# Patient Record
Sex: Female | Born: 1965
Health system: Southern US, Community
[De-identification: ages and names within clinical notes are randomized; demographics above are authoritative.]

## PROBLEM LIST (undated history)

## (undated) DIAGNOSIS — J45909 Unspecified asthma, uncomplicated: Secondary | ICD-10-CM

## (undated) DIAGNOSIS — T7840XA Allergy, unspecified, initial encounter: Secondary | ICD-10-CM

## (undated) DIAGNOSIS — M1990 Unspecified osteoarthritis, unspecified site: Secondary | ICD-10-CM

## (undated) DIAGNOSIS — Z8041 Family history of malignant neoplasm of ovary: Secondary | ICD-10-CM

## (undated) HISTORY — DX: Unspecified osteoarthritis, unspecified site: M19.90

## (undated) HISTORY — DX: Allergy, unspecified, initial encounter: T78.40XA

## (undated) HISTORY — DX: Unspecified asthma, uncomplicated: J45.909

## (undated) HISTORY — DX: Family history of malignant neoplasm of ovary: Z80.41

## (undated) HISTORY — PX: TUBAL LIGATION: SHX77

---

## 2014-02-16 DIAGNOSIS — J302 Other seasonal allergic rhinitis: Secondary | ICD-10-CM | POA: Insufficient documentation

## 2014-03-14 ENCOUNTER — Ambulatory Visit: Payer: Self-pay | Admitting: Family Medicine

## 2014-03-31 ENCOUNTER — Ambulatory Visit: Payer: Self-pay | Admitting: Family Medicine

## 2016-07-05 ENCOUNTER — Emergency Department: Payer: BLUE CROSS/BLUE SHIELD

## 2016-07-05 ENCOUNTER — Emergency Department
Admission: EM | Admit: 2016-07-05 | Discharge: 2016-07-05 | Disposition: A | Discharge status: Home / Self Care | Payer: BLUE CROSS/BLUE SHIELD | Attending: Emergency Medicine | Admitting: Emergency Medicine

## 2016-07-05 ENCOUNTER — Encounter: Payer: Self-pay | Admitting: Emergency Medicine

## 2016-07-05 DIAGNOSIS — M25562 Pain in left knee: Secondary | ICD-10-CM | POA: Diagnosis present

## 2016-07-05 MED ORDER — CYCLOBENZAPRINE HCL 10 MG PO TABS
10.0000 mg | ORAL_TABLET | Freq: Once | ORAL | Status: AC
  Administered 2016-07-05: 10 mg via ORAL
  Filled 2016-07-05: qty 1

## 2016-07-05 MED ORDER — NAPROXEN 500 MG PO TABS: 500.0000 mg | ORAL_TABLET | Freq: Two times a day (BID) | ORAL | 0 refills | Status: DC

## 2016-07-05 MED ORDER — ACETAMINOPHEN 325 MG PO TABS
650.0000 mg | ORAL_TABLET | Freq: Once | ORAL | Status: AC
  Administered 2016-07-05: 650 mg via ORAL
  Filled 2016-07-05: qty 2

## 2016-07-05 MED ORDER — CYCLOBENZAPRINE HCL 10 MG PO TABS: 10.0000 mg | ORAL_TABLET | Freq: Three times a day (TID) | ORAL | 0 refills | Status: DC | PRN

## 2016-07-05 NOTE — ED Notes (Signed)
Pt. States pain to lt. Knee for the past couple weeks.  Pt. States tonight she was entering a pickup up truck to buckle child in car seat when she heard a "popping noise" from lt. Knee.  Pt. States increased pain to lt. Knee.

## 2016-07-05 NOTE — ED Triage Notes (Addendum)
Pt to ed c/o left knee pain after jumping up into truck and feeling a pop in calf area of left leg and knee.

## 2016-07-05 NOTE — Discharge Instructions (Signed)
Follow up with orthopedics in about a week if not improving.  Return to the ER for symptoms that change or worsen if unable to schedule an appointment.

## 2016-07-05 NOTE — ED Notes (Signed)
Pt. Going home with husband. 

## 2016-07-05 NOTE — ED Provider Notes (Signed)
[facility] Emergency Department Provider Note ____________________________________________  Time seen: Approximately 8:27 PM  I have reviewed the triage vital signs and the nursing notes.   HISTORY  Chief Complaint Knee Pain  HPI Leslie Mcdonald is a 51 y.o. female who presents to the emergency department for evaluation of pain in the left knee. She states that she was trying to buckle her grandchild in the car seat in the back of her truck when she heard a pop and a crunch and immediately felt pain in the left posterior leg/knee that then radiated around and into the anterior aspect. She denies falling. She states pain is now worse if she attempts to turn the knee outward or with any weight bearing. She has not taken anything for pain since the injury. She states that she has had some intermittent pain in the left knee for the past couple of weeks, but denies specific injury prior to tonight.  History reviewed. No pertinent past medical history.  There are no active problems to display for this patient.   History reviewed. No pertinent surgical history.  Prior to Admission medications   Medication Sig Start Date End Date Taking? Authorizing Provider  cyclobenzaprine (FLEXERIL) 10 MG tablet Take 1 tablet (10 mg total) by mouth 3 (three) times daily as needed for muscle spasms. 07/05/16   Chinita Pesterari B Domonique Brouillard, FNP  naproxen (NAPROSYN) 500 MG tablet Take 1 tablet (500 mg total) by mouth 2 (two) times daily with a meal. 07/05/16   Chinita Pesterari B Hesston Hitchens, FNP    Allergies Patient has no known allergies.  History reviewed. No pertinent family history.  Social History Social History  Substance Use Topics  . Smoking status: Never Smoker  . Smokeless tobacco: Never Used  . Alcohol use No    Review of Systems Constitutional: No recent illness. Cardiovascular: Denies chest pain or palpitations. Respiratory: Denies shortness of breath. Musculoskeletal: Pain in left  knee. Skin: Negative for rash, wound, lesion. Neurological: Negative for focal weakness or numbness.  ____________________________________________   PHYSICAL EXAM:  VITAL SIGNS: ED Triage Vitals  Enc Vitals Group     BP 07/05/16 1943 119/61     Pulse Rate 07/05/16 1943 77     Resp 07/05/16 1943 18     Temp 07/05/16 1943 98.7 F (37.1 C)     Temp Source 07/05/16 1943 Oral     SpO2 07/05/16 1943 100 %     Weight 07/05/16 1943 240 lb (108.9 kg)     Height 07/05/16 1943 5\' 7"  (1.702 m)     Head Circumference --      Peak Flow --      Pain Score 07/05/16 1854 10     Pain Loc --      Pain Edu? --      Excl. in GC? --     Constitutional: Alert and oriented. Well appearing and in no acute distress. Eyes: Conjunctivae are normal. EOMI. Head: Atraumatic. Neck: No stridor.  Respiratory: Normal respiratory effort.   Musculoskeletal: Left Knee: Pain on varus stress. Patella tracks midline. Significant increase in pain with active flexion knee. Negative Ballottement test. Thompson's test is negative. Neurologic:  Normal speech and language. No gross focal neurologic deficits are appreciated. Speech is normal. Gait not tested due to pain.  Skin:  Skin is warm, dry and intact. Atraumatic. Psychiatric: Mood and affect are normal. Speech and behavior are normal.  ____________________________________________   LABS (all labs ordered are listed, but only abnormal  results are displayed)  Labs Reviewed - No data to display ____________________________________________  RADIOLOGY  Left knee negative for acute bony abnormality per radiology.  ____________________________________________   PROCEDURES  Procedure(s) performed: Knee immobilizer applied to left knee by ER tech. Patient neurovascularly intact post application.  ____________________________________________   INITIAL IMPRESSION / ASSESSMENT AND PLAN / ED COURSE  51 year old female presenting to the emergency department  for evaluation of left knee pain. Exam and imaging consistent with ligamentous strain. She was placed in a knee immobilizer and advised to call and schedule a follow-up appointment with orthopedics if her symptoms are not improving over the week. She'll be given a prescription for Flexeril and Naprosyn and advised to rest, ice, and elevate the left lower extremity often throughout the day. She was instructed to return to emergency department for symptoms change or worsen if she is unable schedule an appointment.  Pertinent labs & imaging results that were available during my care of the patient were reviewed by me and considered in my medical decision making (see chart for details).  _________________________________________   FINAL CLINICAL IMPRESSION(S) / ED DIAGNOSES  Final diagnoses:  Acute pain of left knee    Discharge Medication List as of 07/05/2016  9:42 PM    START taking these medications   Details  cyclobenzaprine (FLEXERIL) 10 MG tablet Take 1 tablet (10 mg total) by mouth 3 (three) times daily as needed for muscle spasms., Starting Sat 07/05/2016, Print    naproxen (NAPROSYN) 500 MG tablet Take 1 tablet (500 mg total) by mouth 2 (two) times daily with a meal., Starting Sat 07/05/2016, Print        If controlled substance prescribed during this visit, 12 month history viewed on the [facility] prior to issuing an initial prescription for Schedule II or III opiod.    Chinita Pester, FNP 07/06/16 0020    Emily Filbert, MD 07/06/16 [phone]

## 2016-11-27 DIAGNOSIS — M25662 Stiffness of left knee, not elsewhere classified: Secondary | ICD-10-CM | POA: Diagnosis not present

## 2016-11-27 DIAGNOSIS — M25562 Pain in left knee: Secondary | ICD-10-CM | POA: Diagnosis not present

## 2016-11-27 DIAGNOSIS — M6281 Muscle weakness (generalized): Secondary | ICD-10-CM | POA: Diagnosis not present

## 2016-12-11 DIAGNOSIS — M25662 Stiffness of left knee, not elsewhere classified: Secondary | ICD-10-CM | POA: Diagnosis not present

## 2016-12-11 DIAGNOSIS — M6281 Muscle weakness (generalized): Secondary | ICD-10-CM | POA: Diagnosis not present

## 2016-12-11 DIAGNOSIS — M25562 Pain in left knee: Secondary | ICD-10-CM | POA: Diagnosis not present

## 2016-12-18 DIAGNOSIS — M25562 Pain in left knee: Secondary | ICD-10-CM | POA: Diagnosis not present

## 2016-12-18 DIAGNOSIS — M25662 Stiffness of left knee, not elsewhere classified: Secondary | ICD-10-CM | POA: Diagnosis not present

## 2016-12-18 DIAGNOSIS — M6281 Muscle weakness (generalized): Secondary | ICD-10-CM | POA: Diagnosis not present

## 2018-09-21 IMAGING — CR DG KNEE COMPLETE 4+V*L*
1 series · 4 of 4 positions shown · non-contrast
Comparison: None.

CLINICAL DATA: Left knee pain after jumping up into a truck earlier
tonight.

EXAM:
LEFT KNEE - COMPLETE 4+ VIEW

[Series 1: dg knee complete 4 views left · 0.14mm/px · 4 of 4 slices shown]
[im 1/4]
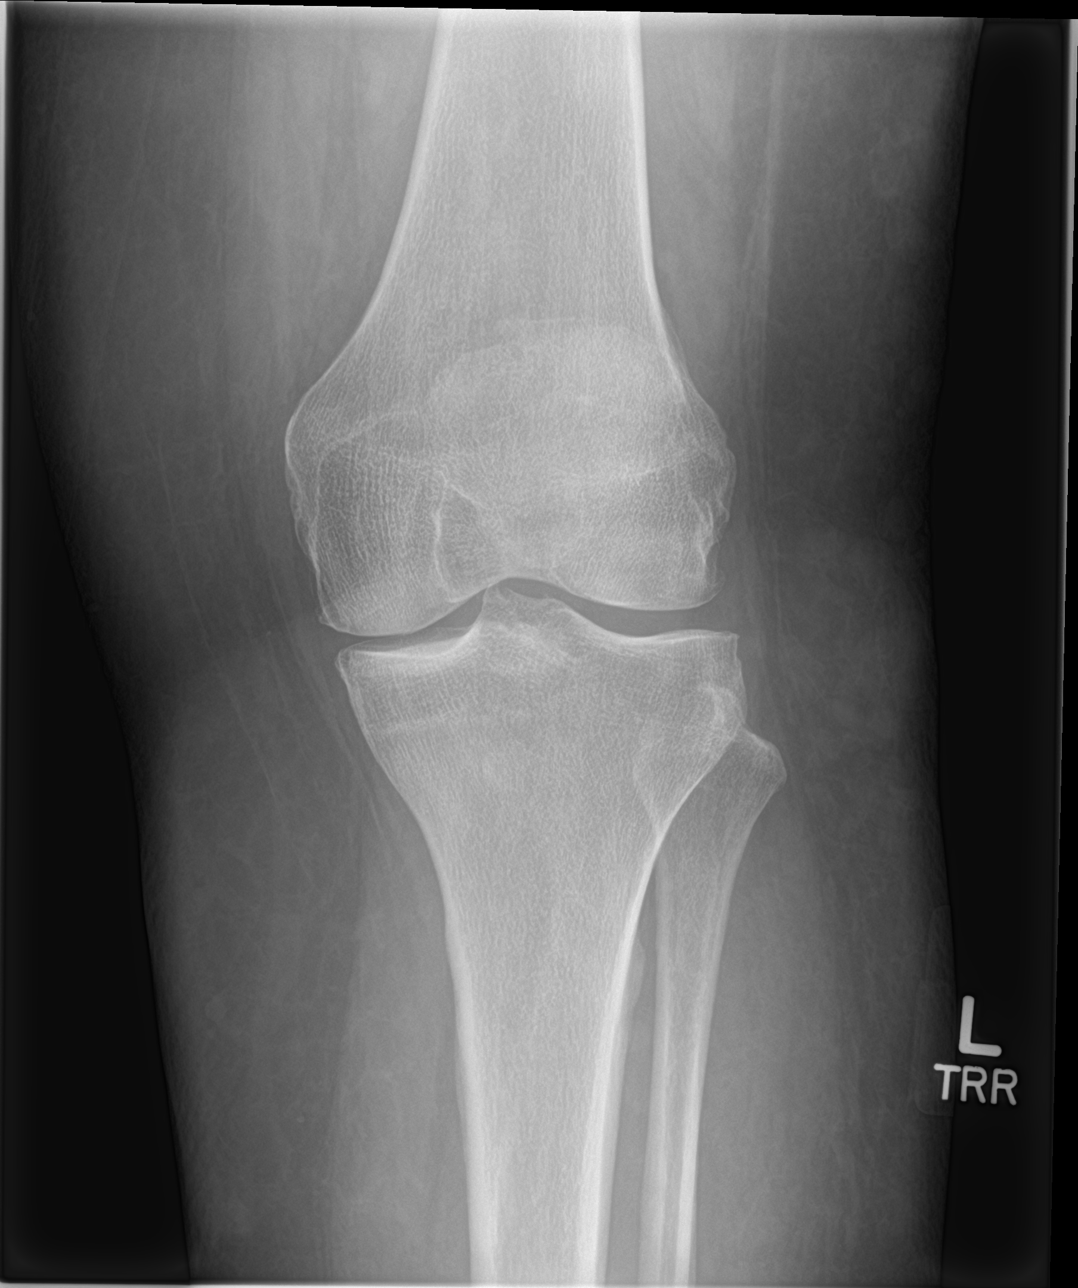
[im 2/4]
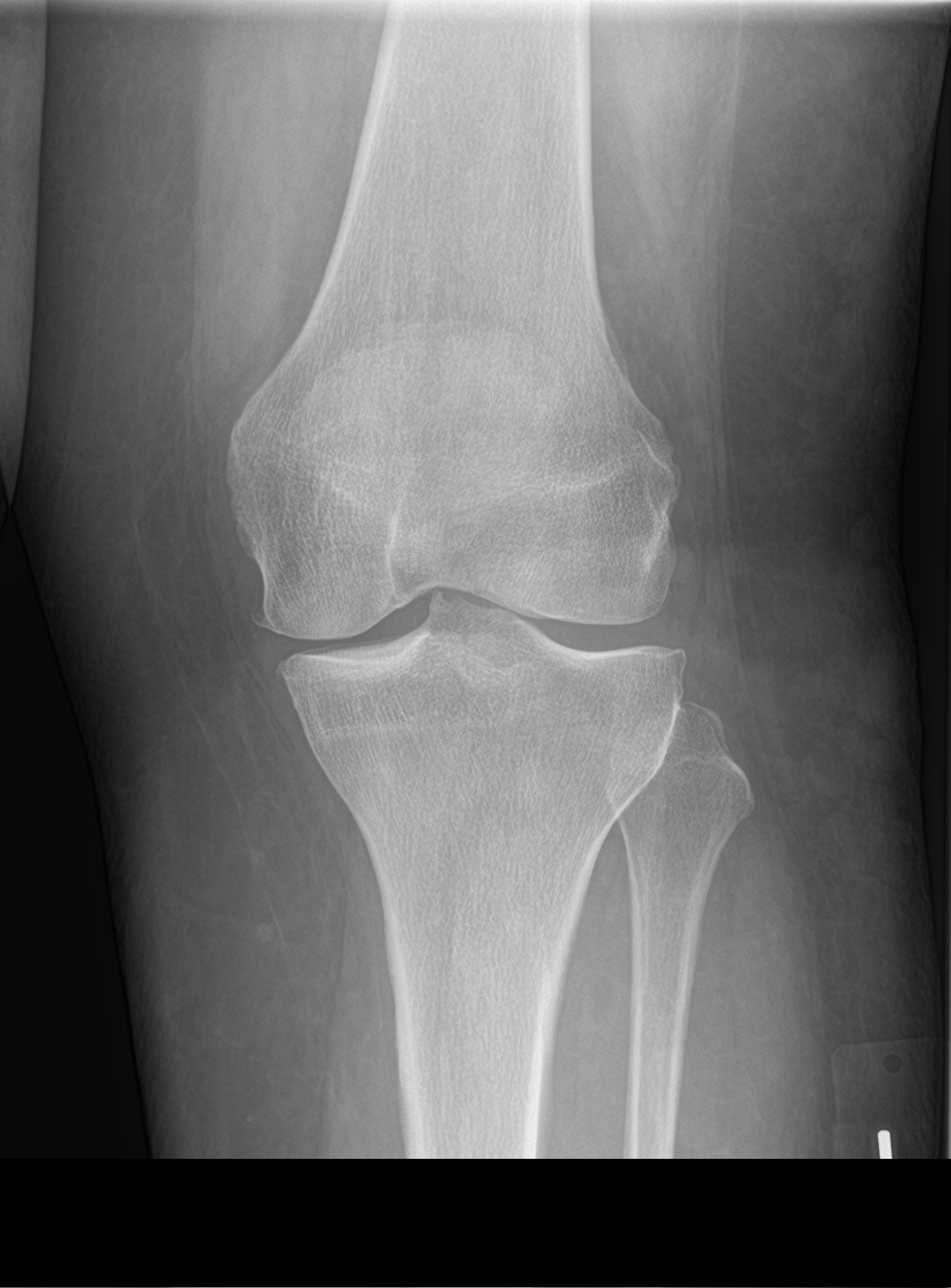
[im 3/4]
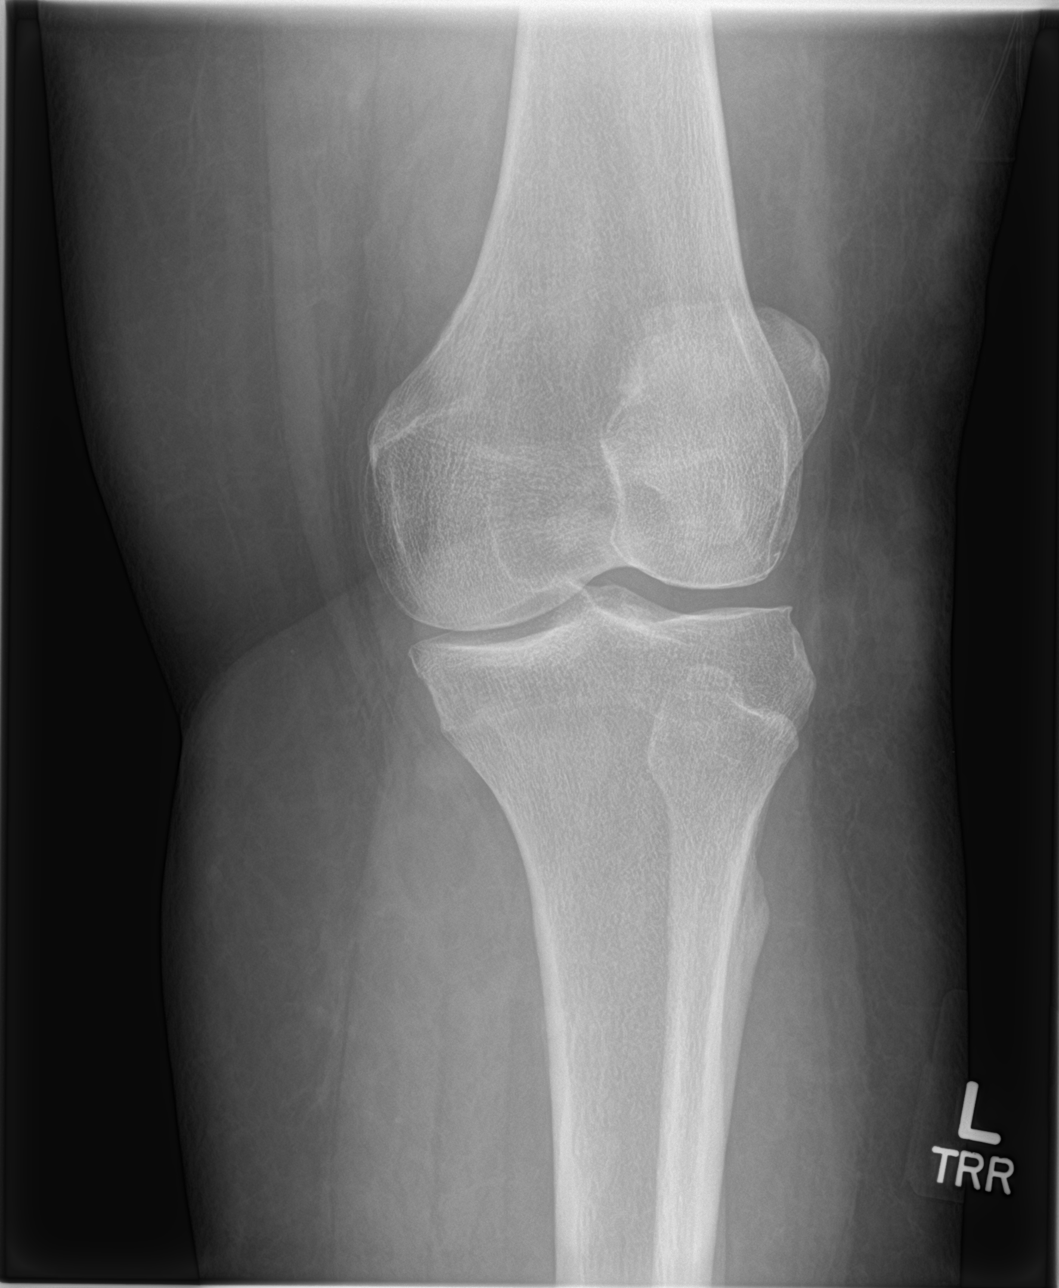
[im 4/4]
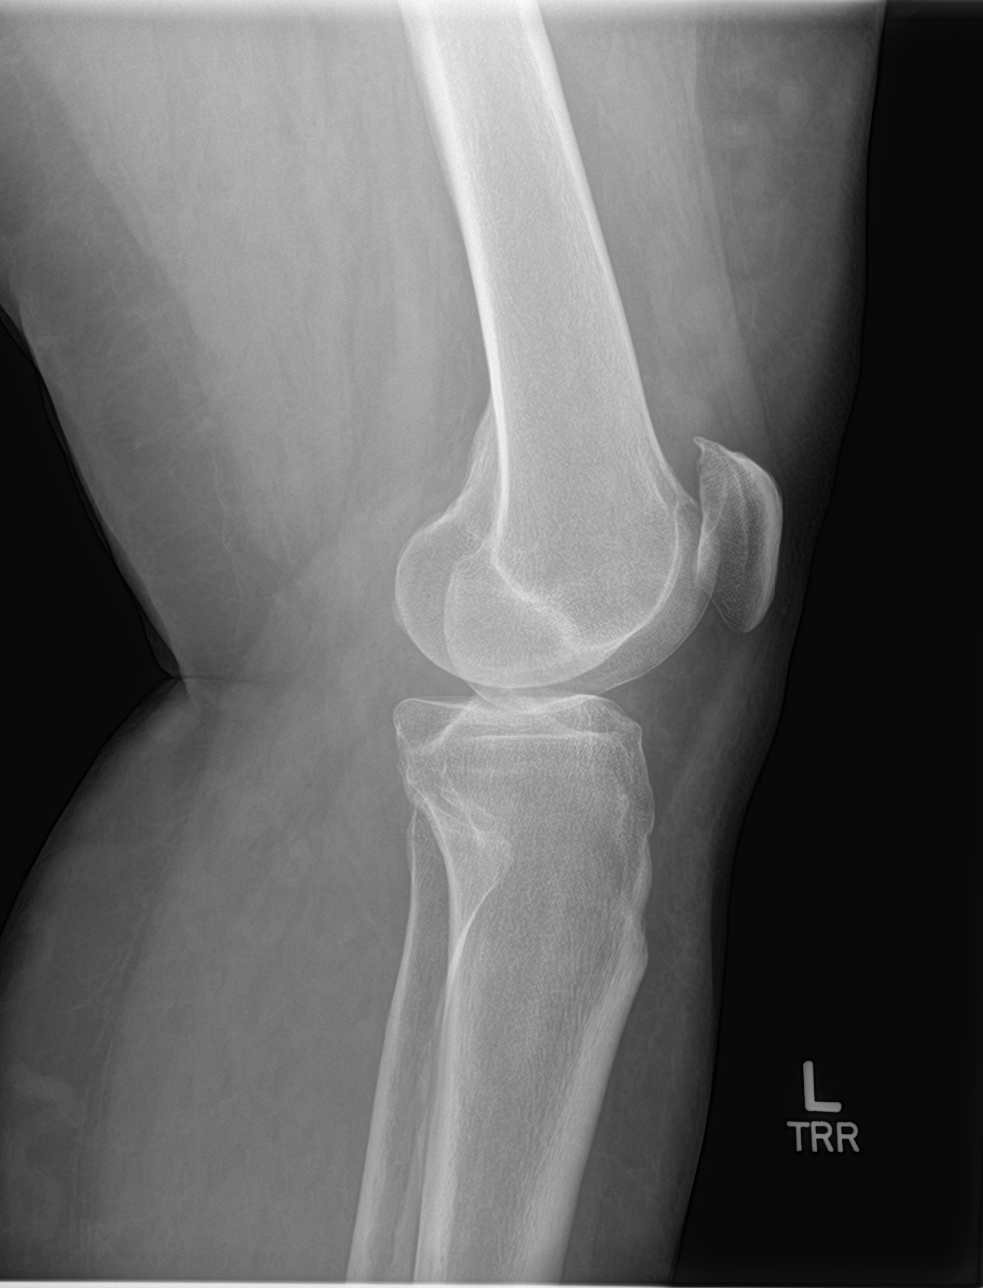

[4 of 4 positions shown; findings below may reference images not displayed]

FINDINGS: Negative for acute fracture or dislocation. Mild osteoarthritic
changes are present in the medial and patellofemoral compartments.
No bone lesion or bony destruction. No acute soft tissue
abnormality.
IMPRESSION: Osteoarthritis.  No acute findings.

## 2018-10-22 ENCOUNTER — Encounter: Payer: Self-pay | Admitting: Physician Assistant

## 2018-10-22 ENCOUNTER — Ambulatory Visit: Payer: BC Managed Care – PPO | Admitting: Physician Assistant

## 2018-10-22 ENCOUNTER — Other Ambulatory Visit: Payer: Self-pay

## 2018-10-22 VITALS — BP 134/85 | HR 64 | Temp 98.5°F | Resp 16 | Ht 67.0 in | Wt 251.2 lb

## 2018-10-22 DIAGNOSIS — R600 Localized edema: Secondary | ICD-10-CM | POA: Diagnosis not present

## 2018-10-22 NOTE — Progress Notes (Signed)
Patient: Leslie Mcdonald, Female    DOB: 1966-02-12, 53 y.o.   MRN: [id number] Visit Date: 10/22/2018  Today's Provider: Trinna Post, PA-C   Chief Complaint  Patient presents with  . New Patient (Initial Visit)   Subjective:     Annual physical exam Leslie Mcdonald is a 53 y.o. female who presents today to establish care.  Patient was last seen by Dr. Baldemar Lenis at [facility]. Patient c/o left leg swelling on and off x's several months.   Lives in [place] with husband of 26. Two children, one boy and girl.  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  PAP smear: long long time ago, does not remember when Colonoscopy: Never Mammo: 2015   She reports swelling in both lower extremities that worsens as the day goes on, left worse than right. She reports she works 10 hour days and is on her feet constantly. Denies pain in her legs. She denies SOB or chest pain. Reports she has been off this past week and her leg swelling has improved during this time.  ---------------------------------------------   Review of Systems  Constitutional: Negative.   HENT: Negative.   Eyes: Negative.   Respiratory: Negative.   Cardiovascular: Positive for leg swelling.  Gastrointestinal: Negative.   Endocrine: Negative.   Genitourinary: Negative.   Musculoskeletal: Negative.   Skin: Negative.   Allergic/Immunologic: Positive for environmental allergies.  Neurological: Positive for dizziness.  Hematological: Negative.   Psychiatric/Behavioral: Negative.     Social History      She  reports that she has never smoked. She has never used smokeless tobacco. She reports that she does not drink alcohol.       Social History   Socioeconomic History  . Marital status: Married    Spouse name: Not on file  . Number of children: Not on file  . Years of education: Not on file  . Highest education level: Not on file  Occupational History  . Not on file  Social Needs  . Financial resource  strain: Not on file  . Food insecurity    Worry: Not on file    Inability: Not on file  . Transportation needs    Medical: Not on file    Non-medical: Not on file  Tobacco Use  . Smoking status: Never Smoker  . Smokeless tobacco: Never Used  Substance and Sexual Activity  . Alcohol use: No  . Drug use: Not on file  . Sexual activity: Not on file  Lifestyle  . Physical activity    Days per week: Not on file    Minutes per session: Not on file  . Stress: Not on file  Relationships  . Social Herbalist on phone: Not on file    Gets together: Not on file    Attends religious service: Not on file    Active member of club or organization: Not on file    Attends meetings of clubs or organizations: Not on file    Relationship status: Not on file  Other Topics Concern  . Not on file  Social History Narrative  . Not on file    Past Medical History:  Diagnosis Date  . Allergy   . Asthma      Patient Active Problem List   Diagnosis Date Noted  . Allergic rhinitis, seasonal 02/16/2014    Past Surgical History:  Procedure Laterality Date  . CESAREAN SECTION    .  TUBAL LIGATION      Family History        Family Status  Relation Name Status  . Mother  (Not Specified)  . Father  (Not Specified)        Her family history includes Ovarian cancer in her mother; Pancreatic cancer in her father.      No Known Allergies   Current Outpatient Medications:  .  loratadine (CLARITIN) 10 MG tablet, Take by mouth., Disp: , Rfl:  .  triamcinolone (NASACORT) 55 MCG/ACT AERO nasal inhaler, Place into the nose., Disp: , Rfl:    Patient Care Team: Maryella ShiversPollak, Adriana M, PA-C as PCP - General (Physician Assistant)    Objective:    Vitals: BP 134/85 (BP Location: Left Arm, Patient Position: Sitting, Cuff Size: Large)   Pulse 64   Temp 98.5 F (36.9 C) (Oral)   Resp 16   Ht 5\' 7"  (1.702 m)   Wt 251 lb 3.2 oz (113.9 kg)   SpO2 99%   BMI 39.34 kg/m    Vitals:    10/22/18 1506  BP: 134/85  Pulse: 64  Resp: 16  Temp: 98.5 F (36.9 C)  TempSrc: Oral  SpO2: 99%  Weight: 251 lb 3.2 oz (113.9 kg)  Height: 5\' 7"  (1.702 m)     Physical Exam Constitutional:      Appearance: Normal appearance.  Cardiovascular:     Rate and Rhythm: Normal rate and regular rhythm.  Pulmonary:     Effort: Pulmonary effort is normal.     Breath sounds: Normal breath sounds.  Musculoskeletal:     Right lower leg: Edema present.     Left lower leg: Edema present.     Comments: Mild non-pitting edema in BLE. Varicose veins in both lower extremities.   Skin:    General: Skin is warm and dry.  Neurological:     Mental Status: She is alert.  Psychiatric:        Mood and Affect: Mood normal.      Depression Screen PHQ 2/9 Scores 10/22/2018  PHQ - 2 Score 0  PHQ- 9 Score 1       Assessment & Plan:     Routine Health Maintenance and Physical Exam  Exercise Activities and Dietary recommendations Goals   None      There is no immunization history on file for this patient.  Health Maintenance  Topic Date Due  . HIV Screening  11/27/1979  . TETANUS/TDAP  11/27/1983  . PAP SMEAR-Modifier  11/26/1985  . MAMMOGRAM  11/27/2014  . COLONOSCOPY  11/27/2014  . INFLUENZA VACCINE  11/27/2018     Discussed health benefits of physical activity, and encouraged her to engage in regular exercise appropriate for her age and condition.    1. Bilateral lower extremity edema  Mild edema. Suspect dependent edema worsened by possible venous insufficiency. Recommend compression stockings. Follow up for CPE with PAP.  The entirety of the information documented in the History of Present Illness, Review of Systems and Physical Exam were personally obtained by me. Portions of this information were initially documented by Rondel BatonSulibeya Dimas, CMA and reviewed by me for thoroughness and accuracy.   F/u 1 month for CPE   --------------------------------------------------------------------    Trey SailorsAdriana M Pollak, PA-C  [facility]

## 2018-10-22 NOTE — Patient Instructions (Signed)
[email or web address]   Varicose Veins Varicose veins are veins that have become enlarged, bulged, and twisted. They most often appear in the legs. What are the causes? This condition is caused by damage to the valves in the vein. These valves help blood return to your heart. When they are damaged and they stop working properly, blood may flow backward and back up in the veins near the skin, causing the veins to get larger and appear twisted. The condition can result from any issue that causes blood to back up, like pregnancy, prolonged standing, or obesity. What increases the risk? This condition is more likely to develop in people who are:  On their feet a lot.  Pregnant.  Overweight. What are the signs or symptoms? Symptoms of this condition include:  Bulging, twisted, and bluish veins.  A feeling of heaviness. This may be worse at the end of the day.  Leg pain. This may be worse at the end of the day.  Swelling in the leg.  Changes in skin color over the veins. How is this diagnosed? This condition may be diagnosed based on your symptoms, a physical exam, and an ultrasound test. How is this treated? Treatment for this condition may involve:  Avoiding sitting or standing in one position for long periods of time.  Wearing compression stockings. These stockings help to prevent blood clots and reduce swelling in the legs.  Raising (elevating) the legs when resting.  Losing weight.  Exercising regularly. If you have persistent symptoms or want to improve the way your varicose veins look, you may choose to have a procedure to close the varicose veins off or to remove them. Treatments to close off the veins include:  Sclerotherapy. In this treatment, a solution is injected into a vein to close it off.  Laser treatment. In this treatment, the vein is heated with a laser to close it off.  Radiofrequency vein ablation. In this treatment, an electrical current produced by  radio waves is used to close off the vein. Treatments to remove the veins include:  Phlebectomy. In this treatment, the veins are removed through small incisions made over the veins.  Vein ligation and stripping. In this treatment, incisions are made over the veins. The veins are then removed after being tied (ligated) with stitches (sutures). Follow these instructions at home: Activity  Walk as much as possible. Walking increases blood flow. This helps blood return to the heart and takes pressure off your veins. It also increases your cardiovascular strength.  Follow your health care provider's instructions about exercising.  Do not stand or sit in one position for a long period of time.  Do not sit with your legs crossed.  Rest with your legs raised during the day. General instructions   Follow any diet instructions given to you by your health care provider.  Wear compression stockings as directed by your health care provider. Do not wear other kinds of tight clothing around your legs, pelvis, or waist.  Elevate your legs at night to above the level of your heart.  If you get a cut in the skin over the varicose vein and the vein bleeds: ? Lie down with your leg raised. ? Apply firm pressure to the cut with a clean cloth until the bleeding stops. ? Place a bandage (dressing) on the cut. Contact a health care provider if:  The skin around your varicose veins starts to break down.  You have pain, redness, tenderness, or hard swelling  over a vein.  You are uncomfortable because of pain.  You get a cut in the skin over a varicose vein and it will not stop bleeding. Summary  Varicose veins are veins that have become enlarged, bulged, and twisted. They most often appear in the legs.  This condition is caused by damage to the valves in the vein. These valves help blood return to your heart.  Treatment for this condition includes frequent movements, wearing compression  stockings, losing weight, and exercising regularly. In some cases, procedures are done to close off or remove the veins.  Treatment for this condition may include wearing compression stockings, elevating the legs, losing weight, and engaging in regular activity. In some cases, procedures are done to close off or remove the veins. This information is not intended to replace advice given to you by your health care provider. Make sure you discuss any questions you have with your health care provider. Document Released: 01/22/2005 Document Revised: 06/10/2018 Document Reviewed: 05/07/2016 Elsevier Patient Education  2020 [employer]

## 2018-11-18 ENCOUNTER — Encounter: Payer: Self-pay | Admitting: Physician Assistant

## 2018-11-18 DIAGNOSIS — N950 Postmenopausal bleeding: Secondary | ICD-10-CM

## 2018-11-22 ENCOUNTER — Telehealth: Payer: Self-pay | Admitting: Obstetrics & Gynecology

## 2018-11-22 NOTE — Telephone Encounter (Signed)
[facility] referring for Postmenopausal vaginal bleeding. Called and left voicemail for patient to call back to be schedule

## 2018-12-03 ENCOUNTER — Other Ambulatory Visit (HOSPITAL_COMMUNITY)
Admission: RE | Admit: 2018-12-03 | Discharge: 2018-12-03 | Disposition: A | Discharge status: Home / Self Care | Payer: BC Managed Care – PPO | Source: Ambulatory Visit | Attending: Obstetrics & Gynecology | Admitting: Obstetrics & Gynecology

## 2018-12-03 ENCOUNTER — Ambulatory Visit (INDEPENDENT_AMBULATORY_CARE_PROVIDER_SITE_OTHER): Payer: BC Managed Care – PPO | Admitting: Obstetrics & Gynecology

## 2018-12-03 ENCOUNTER — Other Ambulatory Visit: Payer: Self-pay

## 2018-12-03 ENCOUNTER — Encounter: Payer: Self-pay | Admitting: Obstetrics & Gynecology

## 2018-12-03 VITALS — BP 140/90 | Ht 67.0 in | Wt 253.0 lb

## 2018-12-03 DIAGNOSIS — N950 Postmenopausal bleeding: Secondary | ICD-10-CM | POA: Insufficient documentation

## 2018-12-03 DIAGNOSIS — N858 Other specified noninflammatory disorders of uterus: Secondary | ICD-10-CM | POA: Diagnosis not present

## 2018-12-03 DIAGNOSIS — Z124 Encounter for screening for malignant neoplasm of cervix: Secondary | ICD-10-CM | POA: Diagnosis not present

## 2018-12-03 NOTE — Patient Instructions (Signed)
Postmenopausal Bleeding  Postmenopausal bleeding is any bleeding that occurs after menopause. Menopause is when a woman's period stops. Any type of bleeding after menopause should be checked by your doctor. Treatment will depend on the cause. Follow these instructions at home:  Pay attention to any changes in your symptoms.  Avoid using tampons and douches as told by your doctor.  Change your pads regularly.  Get regular pelvic exams and Pap tests.  Take iron pills as told by your doctor.  Take over-the-counter and prescription medicines only as told by your doctor.  Keep all follow-up visits as told by your doctor. This is important. Contact a doctor if:  Your bleeding lasts for more than 1 week.  You have pain in your belly (abdomen).  You have bleeding during or after sex.  You have bleeding that happens more often than every 3 weeks. Get help right away if:  You have fever, chills, headache, dizziness, muscle aches, or bleeding.  You have very bad pain with bleeding.  You have clumps of blood (blood clots) coming from your vagina.  You have a lot of bleeding, and: ? You use more than 1 pad an hour. ? This kind of bleeding has never happened before.  You feel like you are going to pass out (faint). Summary  Any type of bleeding after menopause should be checked by your doctor.  Pay attention to any changes in your symptoms.  Keep all follow-up visits as told by your doctor. This information is not intended to replace advice given to you by your health care provider. Make sure you discuss any questions you have with your health care provider. Document Released: 01/22/2008 Document Revised: 07/01/2018 Document Reviewed: 05/20/2016 Elsevier Patient Education  2020 [place].   Endometrial Biopsy, Care After This sheet gives you information about how to care for yourself after your procedure. Your health care provider may also give you more specific  instructions. If you have problems or questions, contact your health care provider. What can I expect after the procedure? After the procedure, it is common to have:  Mild cramping.  A small amount of vaginal bleeding for a few days. This is normal. Follow these instructions at home:   Take over-the-counter and prescription medicines only as told by your health care provider.  Do not douche, use tampons, or have sexual intercourse until your health care provider approves.  Return to your normal activities as told by your health care provider. Ask your health care provider what activities are safe for you.  Follow instructions from your health care provider about any activity restrictions, such as restrictions on strenuous exercise or heavy lifting. Contact a health care provider if:  You have heavy bleeding, or bleed for longer than 2 days after the procedure.  You have bad smelling discharge from your vagina.  You have a fever or chills.  You have a burning sensation when urinating or you have difficulty urinating.  You have severe pain in your lower abdomen. Get help right away if:  You have severe cramps in your stomach or back.  You pass large blood clots.  Your bleeding increases.  You become weak or light-headed, or you pass out. Summary  After the procedure, it is common to have mild cramping and a small amount of vaginal bleeding for a few days.  Do not douche, use tampons, or have sexual intercourse until your health care provider approves.  Return to your normal activities as told by your  health care provider. Ask your health care provider what activities are safe for you. This information is not intended to replace advice given to you by your health care provider. Make sure you discuss any questions you have with your health care provider. Document Released: 02/02/2013 Document Revised: 03/27/2017 Document Reviewed: 04/30/2016 Elsevier Patient Education  2020  [employer]

## 2018-12-03 NOTE — Progress Notes (Signed)
Consultant: Dr Beryle FlockBacigalupo Reason: PMB  Postmenopausal Bleeding Patient complains of vaginal bleeding. She has been menopausal for 4 years. Currently on no HRT and other new medicines or blood thinners. Bleeding is described as less flow than a normal period and has occurred 1 times and lasted just for about 18 hours.  Also reports bloating during that time.  No recent trauma.  Sexually active, some dryness and dyspareunia.  Occas hot flash. Workup to date: none.  Menstrual History: OB History    Gravida  2   Para  2   Term  2   Preterm      AB      Living  2     SAB      TAB      Ectopic      Multiple      Live Births              PMHx: She  has a past medical history of Allergy and Asthma. Also,  has a past surgical history that includes Cesarean section and Tubal ligation., family history includes Ovarian cancer in her mother; Pancreatic cancer in her father.,  reports that she has never smoked. She has never used smokeless tobacco. She reports that she does not drink alcohol.  She has a current medication list which includes the following prescription(s): loratadine and triamcinolone. Also, has No Known Allergies.  Review of Systems  Constitutional: Negative for chills, fever and malaise/fatigue.  HENT: Negative for congestion, sinus pain and sore throat.   Eyes: Negative for blurred vision and pain.  Respiratory: Negative for cough and wheezing.   Cardiovascular: Negative for chest pain and leg swelling.  Gastrointestinal: Negative for abdominal pain, constipation, diarrhea, heartburn, nausea and vomiting.  Genitourinary: Negative for dysuria, frequency, hematuria and urgency.  Musculoskeletal: Negative for back pain, joint pain, myalgias and neck pain.  Skin: Negative for itching and rash.  Neurological: Negative for dizziness, tremors and weakness.  Endo/Heme/Allergies: Does not bruise/bleed easily.  Psychiatric/Behavioral: Negative for depression. The  patient is not nervous/anxious and does not have insomnia.     Objective: BP 140/90   Ht 5\' 7"  (1.702 m)   Wt 253 lb (114.8 kg)   BMI 39.63 kg/m  Physical Exam Constitutional:      General: She is not in acute distress.    Appearance: She is well-developed.  Genitourinary:     Pelvic exam was performed with patient supine.     Vulva, urethra, bladder, vagina, uterus, right adnexa, left adnexa and rectum normal.     No lesions in the vagina.     No vaginal bleeding.     No cervical friability, lesion, bleeding or polyp.     Uterus is mobile.     Uterus is not enlarged.     No uterine mass detected.    Uterus is midaxial.     No right or left adnexal mass present.     Right adnexa not tender.     Left adnexa not tender.     Genitourinary Comments: Small cervix Min vag atrophy  HENT:     Head: Normocephalic and atraumatic. No laceration.     Right Ear: Hearing normal.     Left Ear: Hearing normal.     Mouth/Throat:     Pharynx: Uvula midline.  Eyes:     Pupils: Pupils are equal, round, and reactive to light.  Neck:     Musculoskeletal: Normal range of motion and neck supple.  Thyroid: No thyromegaly.  Cardiovascular:     Rate and Rhythm: Normal rate and regular rhythm.     Heart sounds: No murmur. No friction rub. No gallop.   Pulmonary:     Effort: Pulmonary effort is normal. No respiratory distress.     Breath sounds: Normal breath sounds. No wheezing.  Abdominal:     General: Bowel sounds are normal. There is no distension.     Palpations: Abdomen is soft.     Tenderness: There is no abdominal tenderness. There is no rebound.  Musculoskeletal: Normal range of motion.  Neurological:     Mental Status: She is alert and oriented to person, place, and time.     Cranial Nerves: No cranial nerve deficit.  Skin:    General: Skin is warm and dry.  Psychiatric:        Judgment: Judgment normal.  Vitals signs reviewed.     ASSESSMENT/PLAN:   Problem List Items  Addressed This Visit      Other   Post-menopausal bleeding - Primary   Relevant Orders   Surgical pathology w EMB   US PELVIC COMPLETE WITH TRANSVAGINAL - soon  Etiology of bleeding discussed, cancer risks among other causes Will discuss further after results    Other Visit Diagnoses    Screening for cervical cancer       Relevant Orders   Cytology - PAP     Endometrial Biopsy After discussion with the patient regarding her abnormal uterine bleeding I recommended that she proceed with an endometrial biopsy for further diagnosis. The risks, benefits, alternatives, and indications for an endometrial biopsy were discussed with the patient in detail. She understood the risks including infection, bleeding, cervical laceration and uterine perforation.  Verbal consent was obtained.   PROCEDURE NOTE:  Pipelle endometrial biopsy was performed using aseptic technique with iodine preparation.  The uterus was sounded to a length of 6 cm.  Adequate sampling was obtained with minimal blood loss.  The patient tolerated the procedure well.  Disposition will be pending pathology.  Barnett Applebaum, MD, [facility], [facility] 12/03/2018  3:51 PM

## 2018-12-07 ENCOUNTER — Encounter: Payer: Self-pay | Admitting: Physician Assistant

## 2018-12-07 ENCOUNTER — Ambulatory Visit (INDEPENDENT_AMBULATORY_CARE_PROVIDER_SITE_OTHER): Payer: BC Managed Care – PPO | Admitting: Physician Assistant

## 2018-12-07 ENCOUNTER — Other Ambulatory Visit: Payer: Self-pay

## 2018-12-07 DIAGNOSIS — Z114 Encounter for screening for human immunodeficiency virus [HIV]: Secondary | ICD-10-CM | POA: Diagnosis not present

## 2018-12-07 DIAGNOSIS — Z1239 Encounter for other screening for malignant neoplasm of breast: Secondary | ICD-10-CM

## 2018-12-07 DIAGNOSIS — Z0000 Encounter for general adult medical examination without abnormal findings: Secondary | ICD-10-CM

## 2018-12-07 DIAGNOSIS — Z23 Encounter for immunization: Secondary | ICD-10-CM | POA: Diagnosis not present

## 2018-12-07 DIAGNOSIS — Z800 Family history of malignant neoplasm of digestive organs: Secondary | ICD-10-CM | POA: Diagnosis not present

## 2018-12-07 DIAGNOSIS — Z1211 Encounter for screening for malignant neoplasm of colon: Secondary | ICD-10-CM

## 2018-12-07 LAB — CYTOLOGY - PAP
Diagnosis: NEGATIVE
HPV: NOT DETECTED

## 2018-12-07 NOTE — Patient Instructions (Signed)
Health Maintenance, Female Adopting a healthy lifestyle and getting preventive care are important in promoting health and wellness. Ask your health care provider about:  The right schedule for you to have regular tests and exams.  Things you can do on your own to prevent diseases and keep yourself healthy. What should I know about diet, weight, and exercise? Eat a healthy diet   Eat a diet that includes plenty of vegetables, fruits, low-fat dairy products, and lean protein.  Do not eat a lot of foods that are high in solid fats, added sugars, or sodium. Maintain a healthy weight Body mass index (BMI) is used to identify weight problems. It estimates body fat based on height and weight. Your health care provider can help determine your BMI and help you achieve or maintain a healthy weight. Get regular exercise Get regular exercise. This is one of the most important things you can do for your health. Most adults should:  Exercise for at least 150 minutes each week. The exercise should increase your heart rate and make you sweat (moderate-intensity exercise).  Do strengthening exercises at least twice a week. This is in addition to the moderate-intensity exercise.  Spend less time sitting. Even light physical activity can be beneficial. Watch cholesterol and blood lipids Have your blood tested for lipids and cholesterol at 54 years of age, then have this test every 5 years. Have your cholesterol levels checked more often if:  Your lipid or cholesterol levels are high.  You are older than 54 years of age.  You are at high risk for heart disease. What should I know about cancer screening? Depending on your health history and family history, you may need to have cancer screening at various ages. This may include screening for:  Breast cancer.  Cervical cancer.  Colorectal cancer.  Skin cancer.  Lung cancer. What should I know about heart disease, diabetes, and high blood  pressure? Blood pressure and heart disease  High blood pressure causes heart disease and increases the risk of stroke. This is more likely to develop in people who have high blood pressure readings, are of African descent, or are overweight.  Have your blood pressure checked: ? Every 3-5 years if you are 18-39 years of age. ? Every year if you are 40 years old or older. Diabetes Have regular diabetes screenings. This checks your fasting blood sugar level. Have the screening done:  Once every three years after age 40 if you are at a normal weight and have a low risk for diabetes.  More often and at a younger age if you are overweight or have a high risk for diabetes. What should I know about preventing infection? Hepatitis B If you have a higher risk for hepatitis B, you should be screened for this virus. Talk with your health care provider to find out if you are at risk for hepatitis B infection. Hepatitis C Testing is recommended for:  Everyone born from 1945 through 1965.  Anyone with known risk factors for hepatitis C. Sexually transmitted infections (STIs)  Get screened for STIs, including gonorrhea and chlamydia, if: ? You are sexually active and are younger than 54 years of age. ? You are older than 54 years of age and your health care provider tells you that you are at risk for this type of infection. ? Your sexual activity has changed since you were last screened, and you are at increased risk for chlamydia or gonorrhea. Ask your health care provider if   you are at risk.  Ask your health care provider about whether you are at high risk for HIV. Your health care provider may recommend a prescription medicine to help prevent HIV infection. If you choose to take medicine to prevent HIV, you should first get tested for HIV. You should then be tested every 3 months for as long as you are taking the medicine. Pregnancy  If you are about to stop having your period (premenopausal) and  you may become pregnant, seek counseling before you get pregnant.  Take 400 to 800 micrograms (mcg) of folic acid every day if you become pregnant.  Ask for birth control (contraception) if you want to prevent pregnancy. Osteoporosis and menopause Osteoporosis is a disease in which the bones lose minerals and strength with aging. This can result in bone fractures. If you are 65 years old or older, or if you are at risk for osteoporosis and fractures, ask your health care provider if you should:  Be screened for bone loss.  Take a calcium or vitamin D supplement to lower your risk of fractures.  Be given hormone replacement therapy (HRT) to treat symptoms of menopause. Follow these instructions at home: Lifestyle  Do not use any products that contain nicotine or tobacco, such as cigarettes, e-cigarettes, and chewing tobacco. If you need help quitting, ask your health care provider.  Do not use street drugs.  Do not share needles.  Ask your health care provider for help if you need support or information about quitting drugs. Alcohol use  Do not drink alcohol if: ? Your health care provider tells you not to drink. ? You are pregnant, may be pregnant, or are planning to become pregnant.  If you drink alcohol: ? Limit how much you use to 0-1 drink a day. ? Limit intake if you are breastfeeding.  Be aware of how much alcohol is in your drink. In the [place] one drink equals one 12 oz bottle of beer (355 mL), one 5 oz glass of wine (148 mL), or one 1 oz glass of hard liquor (44 mL). General instructions  Schedule regular health, dental, and eye exams.  Stay current with your vaccines.  Tell your health care provider if: ? You often feel depressed. ? You have ever been abused or do not feel safe at home. Summary  Adopting a healthy lifestyle and getting preventive care are important in promoting health and wellness.  Follow your health care provider's instructions about healthy  diet, exercising, and getting tested or screened for diseases.  Follow your health care provider's instructions on monitoring your cholesterol and blood pressure. This information is not intended to replace advice given to you by your health care provider. Make sure you discuss any questions you have with your health care provider. Document Released: 10/28/2010 Document Revised: 04/07/2018 Document Reviewed: 04/07/2018 Elsevier Patient Education  2020 Elsevier Inc.  

## 2018-12-07 NOTE — Progress Notes (Signed)
Patient: Leslie Mcdonald, Female    DOB: 01/01/1966, 53 y.o.   MRN: [id number] Visit Date: 12/07/2018  Today's Provider: Trey SailorsAdriana M Pollak, PA-C   Chief Complaint  Patient presents with  . Annual Exam   Subjective:     Annual physical exam Leslie Mcdonald is a 53 y.o. female who presents today for health maintenance and complete physical. She feels well. She reports exercising regularly. She reports she is sleeping well.  Breast Cancer Screening: 2015 Colon Cancer Screening: Dad diagnosed with colon cancer in early 3840's and 50's and died when patient was 5 of colon cancer. Never screened for colon cancer. Tetanus shot: Due today  In the interim, she has had some vaginal bleeding for 18 hours. She went through menopause around age 54-50 and had not had a PAP in many years. She was [facility] by [facility]urc[facility] 12/03/2018 and had a PAP done which was normal and an endometrial biopsy which was also normal. She is scheduled for a pelvic ultrasound on 12/15/2018.  She has gotten some [employer] compression stockings and reports these are helping her lower extremity edema.   Diet Breakfast: lucky charms or cinnamon oats Lunch: Sandwich from homes, pizza from [employer], salad, crackers and cheese from home Dinner: BBQ chicken, pork chops, BLT, potatoes and vegetables Soda: will have four ounces of coke with lunch No gatorade, does have orange juice with breakfast Sweet tea: no sweet tea Mostly water.  Sweets: cup of ice cream 2-3 times per week, M&Ms at home Snacks: chips, belvita snack cookies  -----------------------------------------------------------------   Review of Systems  Constitutional: Negative.   HENT: Negative.   Eyes: Negative.   Respiratory: Negative.   Cardiovascular: Positive for leg swelling. Negative for chest pain and palpitations.  Gastrointestinal: Negative.   Endocrine: Negative.   Genitourinary: Positive for vaginal bleeding. Negative for  decreased urine volume, difficulty urinating, dyspareunia, dysuria, enuresis, flank pain, frequency, genital sores, hematuria, menstrual problem, pelvic pain, urgency, vaginal discharge and vaginal pain.  Musculoskeletal: Negative.   Skin: Negative.   Allergic/Immunologic: Negative.   Neurological: Negative.   Psychiatric/Behavioral: Negative.     Social History      She  reports that she has never smoked. She has never used smokeless tobacco. She reports that she does not drink alcohol.       Social History   Socioeconomic History  . Marital status: Married    Spouse name: Not on file  . Number of children: Not on file  . Years of education: Not on file  . Highest education level: Not on file  Occupational History  . Not on file  Social Needs  . Financial resource strain: Not on file  . Food insecurity    Worry: Not on file    Inability: Not on file  . Transportation needs    Medical: Not on file    Non-medical: Not on file  Tobacco Use  . Smoking status: Never Smoker  . Smokeless tobacco: Never Used  Substance and Sexual Activity  . Alcohol use: No  . Drug use: Not on file  . Sexual activity: Yes  Lifestyle  . Physical activity    Days per week: Not on file    Minutes per session: Not on file  . Stress: Not on file  Relationships  . Social Musicianconnections    Talks on phone: Not on file    Gets together: Not on file  Attends religious service: Not on file    Active member of club or organization: Not on file    Attends meetings of clubs or organizations: Not on file    Relationship status: Not on file  Other Topics Concern  . Not on file  Social History Narrative  . Not on file    Past Medical History:  Diagnosis Date  . Allergy   . Asthma      Patient Active Problem List   Diagnosis Date Noted  . Post-menopausal bleeding 12/03/2018  . Allergic rhinitis, seasonal 02/16/2014    Past Surgical History:  Procedure Laterality Date  . CESAREAN SECTION     . TUBAL LIGATION      Family History        Family Status  Relation Name Status  . Mother  (Not Specified)  . Father  (Not Specified)        Her family history includes Ovarian cancer in her mother; Pancreatic cancer in her father.      No Known Allergies   Current Outpatient Medications:  .  loratadine (CLARITIN) 10 MG tablet, Take by mouth., Disp: , Rfl:  .  triamcinolone (NASACORT) 55 MCG/ACT AERO nasal inhaler, Place into the nose., Disp: , Rfl:    Patient Care Team: Maryella ShiversPollak, Adriana M, PA-C as PCP - General (Physician Assistant)    Objective:    Vitals: BP 132/81 (BP Location: Left Arm, Patient Position: Sitting, Cuff Size: Large)   Pulse 61   Temp 97.7 F (36.5 C) (Oral)   Ht 5\' 7"  (1.702 m)   Wt 251 lb (113.9 kg)   BMI 39.31 kg/m    Vitals:   12/07/18 0935  BP: 132/81  Pulse: 61  Temp: 97.7 F (36.5 C)  TempSrc: Oral  Weight: 251 lb (113.9 kg)  Height: 5\' 7"  (1.702 m)     Physical Exam Constitutional:      Appearance: Normal appearance.  HENT:     Right Ear: Tympanic membrane and ear canal normal.     Left Ear: Tympanic membrane and ear canal normal.  Cardiovascular:     Rate and Rhythm: Normal rate and regular rhythm.     Heart sounds: Normal heart sounds.  Pulmonary:     Effort: Pulmonary effort is normal.     Breath sounds: Normal breath sounds.  Abdominal:     General: Abdomen is flat. Bowel sounds are normal.  Skin:    General: Skin is warm and dry.  Neurological:     Mental Status: She is alert and oriented to person, place, and time. Mental status is at baseline.  Psychiatric:        Mood and Affect: Mood normal.        Behavior: Behavior normal.      Depression Screen PHQ 2/9 Scores 10/22/2018  PHQ - 2 Score 0  PHQ- 9 Score 1       Assessment & Plan:     Routine Health Maintenance and Physical Exam  Exercise Activities and Dietary recommendations Goals   None      There is no immunization history on file for this  patient.  Health Maintenance  Topic Date Due  . HIV Screening  11/27/1979  . TETANUS/TDAP  11/27/1983  . INFLUENZA VACCINE  11/27/2018  . MAMMOGRAM  12/03/2019 (Originally 11/27/2014)  . COLONOSCOPY  12/03/2019 (Originally 11/27/2014)  . PAP SMEAR-Modifier  12/02/2021     Discussed health benefits of physical activity, and encouraged her to engage in  regular exercise appropriate for her age and condition.    1. Annual physical exam  - Comprehensive Metabolic Panel (CMET) - CBC With Differential - Lipid Profile - TSH  2. Breast cancer screening  Explained self scheduling. Last mammogram 2015.   - MM Digital Screening; Future  3. Colon cancer screening  Explained importance of getting screened with colonoscopy especially since her father died from colon cancer and was diagnosed at a young age.   - Ambulatory referral to Gastroenterology  4. Family history of colon cancer in father  - Ambulatory referral to Gastroenterology  5. Need for prophylactic vaccination against diphtheria and tetanus  - Td : Tetanus/diphtheria >7yo Preservative  free  6. Encounter for screening for HIV  - HIV antibody (with reflex)  The entirety of the information documented in the History of Present Illness, Review of Systems and Physical Exam were personally obtained by me. Portions of this information were initially documented by Ashley Royalty, CMA and reviewed by me for thoroughness and accuracy.   --------------------------------------------------------------------    Trinna Post, PA-C  [facility]

## 2018-12-08 LAB — CBC WITH DIFFERENTIAL
Basophils Absolute: 0 10*3/uL (ref 0.0–0.2)
Basos: 1 %
EOS (ABSOLUTE): 0.2 10*3/uL (ref 0.0–0.4)
Eos: 3 %
Hematocrit: 42.4 % (ref 34.0–46.6)
Hemoglobin: 14.3 g/dL (ref 11.1–15.9)
Immature Grans (Abs): 0 10*3/uL (ref 0.0–0.1)
Immature Granulocytes: 0 %
Lymphocytes Absolute: 1.8 10*3/uL (ref 0.7–3.1)
Lymphs: 30 %
MCH: 30.5 pg (ref 26.6–33.0)
MCHC: 33.7 g/dL (ref 31.5–35.7)
MCV: 90 fL (ref 79–97)
Monocytes Absolute: 0.6 10*3/uL (ref 0.1–0.9)
Monocytes: 10 %
Neutrophils Absolute: 3.4 10*3/uL (ref 1.4–7.0)
Neutrophils: 56 %
RBC: 4.69 x10E6/uL (ref 3.77–5.28)
RDW: 12.5 % (ref 11.7–15.4)
WBC: 6 10*3/uL (ref 3.4–10.8)

## 2018-12-08 LAB — LIPID PANEL
Chol/HDL Ratio: 2.8 ratio (ref 0.0–4.4)
Cholesterol, Total: 163 mg/dL (ref 100–199)
HDL: 59 mg/dL (ref >39)
LDL Calculated: 93 mg/dL (ref 0–99)
Triglycerides: 53 mg/dL (ref 0–149)
VLDL Cholesterol Cal: 11 mg/dL (ref 5–40)

## 2018-12-08 LAB — COMPREHENSIVE METABOLIC PANEL
ALT: 13 IU/L (ref 0–32)
AST: 15 IU/L (ref 0–40)
Albumin/Globulin Ratio: 1.8 (ref 1.2–2.2)
Albumin: 4.2 g/dL (ref 3.8–4.9)
Alkaline Phosphatase: 88 IU/L (ref 39–117)
BUN/Creatinine Ratio: 15 (ref 9–23)
BUN: 10 mg/dL (ref 6–24)
Bilirubin Total: 0.3 mg/dL (ref 0.0–1.2)
CO2: 24 mmol/L (ref 20–29)
Calcium: 9.2 mg/dL (ref 8.7–10.2)
Chloride: 103 mmol/L (ref 96–106)
Creatinine, Ser: 0.67 mg/dL (ref 0.57–1.00)
GFR calc Af Amer: 115 mL/min/{1.73_m2} (ref >59)
GFR calc non Af Amer: 100 mL/min/{1.73_m2} (ref >59)
Globulin, Total: 2.4 g/dL (ref 1.5–4.5)
Glucose: 93 mg/dL (ref 65–99)
Potassium: 4.1 mmol/L (ref 3.5–5.2)
Sodium: 139 mmol/L (ref 134–144)
Total Protein: 6.6 g/dL (ref 6.0–8.5)

## 2018-12-08 LAB — HIV ANTIBODY (ROUTINE TESTING W REFLEX): HIV Screen 4th Generation wRfx: NONREACTIVE

## 2018-12-08 LAB — TSH: TSH: 1.36 u[IU]/mL (ref 0.450–4.500)

## 2018-12-15 ENCOUNTER — Encounter: Payer: Self-pay | Admitting: Obstetrics & Gynecology

## 2018-12-15 ENCOUNTER — Ambulatory Visit (INDEPENDENT_AMBULATORY_CARE_PROVIDER_SITE_OTHER): Payer: BC Managed Care – PPO | Admitting: Obstetrics & Gynecology

## 2018-12-15 ENCOUNTER — Ambulatory Visit (INDEPENDENT_AMBULATORY_CARE_PROVIDER_SITE_OTHER): Payer: BC Managed Care – PPO

## 2018-12-15 ENCOUNTER — Other Ambulatory Visit: Payer: Self-pay

## 2018-12-15 VITALS — BP 138/98 | Ht 67.0 in | Wt 253.0 lb

## 2018-12-15 DIAGNOSIS — N950 Postmenopausal bleeding: Secondary | ICD-10-CM | POA: Diagnosis not present

## 2018-12-15 NOTE — Progress Notes (Signed)
  HPI: Pt had one episode of PMB, none since. No pain.  PAP normal.  EMB normal: Diagnosis Endometrium, biopsy - DEGENERATING SECRETORY-TYPE ENDOMETRIUM. - THERE IS NO EVIDENCE OF HYPERPLASIA OR MALIGNANCY. Ultrasound demonstrates no masses seen, ES 3 mm.  One small 2cm fibroid.  PMHx: She  has a past medical history of Allergy and Asthma. Also,  has a past surgical history that includes Cesarean section and Tubal ligation., family history includes Ovarian cancer in her mother; Pancreatic cancer in her father.,  reports that she has never smoked. She has never used smokeless tobacco. She reports that she does not drink alcohol.  She has a current medication list which includes the following prescription(s): loratadine and triamcinolone. Also, has No Known Allergies.  Review of Systems  All other systems reviewed and are negative.   Objective: BP (!) 138/98   Ht 5\' 7"  (1.702 m)   Wt 253 lb (114.8 kg)   BMI 39.63 kg/m   Physical examination Constitutional NAD, Conversant  Skin No rashes, lesions or ulceration.   Extremities: Moves all appropriately.  Normal ROM for age. No lymphadenopathy.  Neuro: Grossly intact  Psych: Oriented to PPT.  Normal mood. Normal affect.   US Pelvic Complete With Transvaginal  Result Date: 12/15/2018 Patient Name: Leslie Mcdonald DOB: 04-Mar-1966 MRN: [id number] ULTRASOUND REPORT Location: [facility] Date of Service: 12/15/2018 Indications:Abnormal Uterine Bleeding Findings: The uterus is anteverted and measures 8.0 x 4.8 x 3.8 cm. Echo texture is heterogenous. There is a questionable intramural mural fibroid measuring 26 x 24 mm The Endometrium measures 3.1 mm. Right Ovary measures 2.1 x 1.4 x 1.5  cm. It is normal in appearance. Left Ovary measures 1.8 x 1.0 x 1.4 cm. It is normal in appearance. Survey of the adnexa demonstrates no adnexal masses. There is no free fluid in the cul de sac. Impression: 1. The endometrial lining is thin. There is one  questionable intramural fibroid. 2. Normal ovaries. Recommendations: 1.Clinical correlation with the patient's History and Physical Exam. Gweneth Dimitri, RT Review of ULTRASOUND.    I have personally reviewed images and report of recent ultrasound done [facility].    Plan of management to be discussed with patient. Barnett Applebaum, MD, [facility], [facility] 12/15/2018  11:10 AM   Assessment:  Post-menopausal bleeding  - Plan: Monitor for pattern of recurrence.  Otherwise evaluation is neg for risk for cancer  A total of 15 minutes were spent face-to-face with the patient during this encounter and over half of that time dealt with counseling and coordination of care.   Barnett Applebaum, MD, [facility], [facility] 12/15/2018  11:18 AM

## 2018-12-20 ENCOUNTER — Encounter: Payer: Self-pay | Admitting: *Deleted

## 2019-01-05 ENCOUNTER — Encounter: Payer: Self-pay | Admitting: Obstetrics and Gynecology

## 2019-02-21 ENCOUNTER — Telehealth (INDEPENDENT_AMBULATORY_CARE_PROVIDER_SITE_OTHER): Payer: BC Managed Care – PPO | Admitting: Family Medicine

## 2019-02-21 DIAGNOSIS — H60501 Unspecified acute noninfective otitis externa, right ear: Secondary | ICD-10-CM | POA: Diagnosis not present

## 2019-02-21 MED ORDER — CIPROFLOXACIN-DEXAMETHASONE 0.3-0.1 % OT SUSP: 4.0000 [drp] | Freq: Two times a day (BID) | OTIC | 0 refills | Status: AC

## 2019-02-21 NOTE — Progress Notes (Signed)
Patient: Leslie Mcdonald Female    DOB: 02-Nov-1965   53 y.o.   MRN: [id number] Visit Date: 02/21/2019  Today's Provider: Shirlee Latch, MD   Chief Complaint  Patient presents with  . Ear Pain    R ear x3d   Subjective:     Virtual Visit via Video Note  I connected with Leslie Mcdonald on 02/21/19 at  8:40 AM EDT by a video enabled telemedicine application and verified that I am speaking with the correct person using two identifiers.  Patient location: home Provider location: [facility] Persons involved in the visit: patient, provider   I discussed the limitations of evaluation and management by telemedicine and the availability of in person appointments. The patient expressed understanding and agreed to proceed.    HPI  R ear feels like swimmers ear which she had previously.  Pain and pressure for 3 days.  Tried heating pad in case there was wax buildup. Helped a lot and feels like it is draining overnight. TTP just inside ear canal.    Nose is congested and feels like usual allergies.  No fevers, cough, sore throat.   No Known Allergies   Current Outpatient Medications:  .  ciprofloxacin-dexamethasone (CIPRODEX) OTIC suspension, Place 4 drops into the right ear 2 (two) times daily for 7 days., Disp: 7.5 mL, Rfl: 0 .  loratadine (CLARITIN) 10 MG tablet, Take by mouth., Disp: , Rfl:  .  triamcinolone (NASACORT) 55 MCG/ACT AERO nasal inhaler, Place into the nose., Disp: , Rfl:   Review of Systems  Constitutional: Negative.   HENT: Positive for congestion, ear discharge, ear pain, rhinorrhea and sneezing. Negative for dental problem, drooling, facial swelling, hearing loss, mouth sores, nosebleeds, postnasal drip, sinus pressure, sinus pain, sore throat, tinnitus, trouble swallowing and voice change.   Eyes: Negative.   Respiratory: Negative.   Cardiovascular: Negative.   Gastrointestinal: Negative.   Genitourinary: Negative.   Neurological:  Negative.   Psychiatric/Behavioral: Negative.     Social History   Tobacco Use  . Smoking status: Never Smoker  . Smokeless tobacco: Never Used  Substance Use Topics  . Alcohol use: No      Objective:   There were no vitals taken for this visit. There were no vitals filed for this visit.There is no height or weight on file to calculate BMI.   Physical Exam Constitutional:      General: She is not in acute distress.    Appearance: Normal appearance.  Pulmonary:     Effort: Pulmonary effort is normal. No respiratory distress.  Neurological:     Mental Status: She is alert and oriented to person, place, and time.  Psychiatric:        Mood and Affect: Mood normal.        Behavior: Behavior normal.     No results found for any visits on 02/21/19.     Assessment & Plan   I discussed the assessment and treatment plan with the patient. The patient was provided an opportunity to ask questions and all were answered. The patient agreed with the plan and demonstrated an understanding of the instructions.   The patient was advised to call back or seek an in-person evaluation if the symptoms worsen or if the condition fails to improve as anticipated.  1. Acute otitis externa of right ear, unspecified type -New problem -Unable to examine her ear, which I discussed with her would be the ideal way to diagnose this,  but by history, concern for otitis externa -Will treat with Ciprodex drops x7 days -Also discussed symptomatic management -Discussed strict return precautions and need for follow-up if this is not improving   Meds ordered this encounter  Medications  . ciprofloxacin-dexamethasone (CIPRODEX) OTIC suspension    Sig: Place 4 drops into the right ear 2 (two) times daily for 7 days.    Dispense:  7.5 mL    Refill:  0     Return if symptoms worsen or fail to improve.   The entirety of the information documented in the History of Present Illness, Review of Systems and  Physical Exam were personally obtained by me. Portions of this information were initially documented by Leslie Mcdonald, CMA and reviewed by me for thoroughness and accuracy.    Leslie Mcdonald, Dionne Bucy, MD MPH [facility]

## 2019-02-21 NOTE — Patient Instructions (Signed)
Otitis Externa  Otitis externa is an infection of the outer ear canal. The outer ear canal is the area between the outside of the ear and the eardrum. Otitis externa is sometimes called swimmer's ear. What are the causes? Common causes of this condition include:  Swimming in dirty water.  Moisture in the ear.  An injury to the inside of the ear.  An object stuck in the ear.  A cut or scrape on the outside of the ear. What increases the risk? You are more likely to develop this condition if you go swimming often. What are the signs or symptoms? The first symptom of this condition is often itching in the ear. Later symptoms of the condition include:  Swelling of the ear.  Redness in the ear.  Ear pain. The pain may get worse when you pull on your ear.  Pus coming from the ear. How is this diagnosed? This condition may be diagnosed by examining the ear and testing fluid from the ear for bacteria and funguses. How is this treated? This condition may be treated with:  Antibiotic ear drops. These are often given for 10-14 days.  Medicines to reduce itching and swelling. Follow these instructions at home:  If you were prescribed antibiotic ear drops, use them as told by your health care provider. Do not stop using the antibiotic even if your condition improves.  Take over-the-counter and prescription medicines only as told by your health care provider.  Avoid getting water in your ears as told by your health care provider. This may include avoiding swimming or water sports for a few days.  Keep all follow-up visits as told by your health care provider. This is important. How is this prevented?  Keep your ears dry. Use the corner of a towel to dry your ears after you swim or bathe.  Avoid scratching or putting things in your ear. Doing these things can damage the ear canal or remove the protective wax that lines it, which makes it easier for bacteria and funguses to grow.   Avoid swimming in lakes, polluted water, or pools that may not have enough chlorine. Contact a health care provider if:  You have a fever.  Your ear is still red, swollen, painful, or draining pus after 3 days.  Your redness, swelling, or pain gets worse.  You have a severe headache.  You have redness, swelling, pain, or tenderness in the area behind your ear. Summary  Otitis externa is an infection of the outer ear canal.  Common causes include swimming in dirty water, moisture in the ear, or a cut or scrape in the ear.  Symptoms include pain, redness, and swelling of the ear.  If you were prescribed antibiotic ear drops, use them as told by your health care provider. Do not stop using the antibiotic even if your condition improves. This information is not intended to replace advice given to you by your health care provider. Make sure you discuss any questions you have with your health care provider. Document Released: 04/14/2005 Document Revised: 09/18/2017 Document Reviewed: 09/18/2017 Elsevier Patient Education  2020 [employer]  

## 2019-03-23 DIAGNOSIS — M25572 Pain in left ankle and joints of left foot: Secondary | ICD-10-CM | POA: Diagnosis not present

## 2019-03-23 DIAGNOSIS — M25571 Pain in right ankle and joints of right foot: Secondary | ICD-10-CM | POA: Diagnosis not present

## 2019-03-23 DIAGNOSIS — M79672 Pain in left foot: Secondary | ICD-10-CM | POA: Diagnosis not present

## 2019-03-23 DIAGNOSIS — M79671 Pain in right foot: Secondary | ICD-10-CM | POA: Diagnosis not present

## 2019-12-09 DIAGNOSIS — M545 Low back pain: Secondary | ICD-10-CM | POA: Diagnosis not present

## 2019-12-09 DIAGNOSIS — G8929 Other chronic pain: Secondary | ICD-10-CM | POA: Diagnosis not present

## 2019-12-09 DIAGNOSIS — M4316 Spondylolisthesis, lumbar region: Secondary | ICD-10-CM | POA: Diagnosis not present

## 2020-01-04 DIAGNOSIS — M5137 Other intervertebral disc degeneration, lumbosacral region: Secondary | ICD-10-CM | POA: Diagnosis not present

## 2020-01-04 DIAGNOSIS — M4807 Spinal stenosis, lumbosacral region: Secondary | ICD-10-CM | POA: Diagnosis not present

## 2020-01-04 DIAGNOSIS — M47816 Spondylosis without myelopathy or radiculopathy, lumbar region: Secondary | ICD-10-CM | POA: Diagnosis not present

## 2020-01-04 DIAGNOSIS — G8929 Other chronic pain: Secondary | ICD-10-CM | POA: Diagnosis not present

## 2020-01-04 DIAGNOSIS — M545 Low back pain: Secondary | ICD-10-CM | POA: Diagnosis not present

## 2020-03-30 ENCOUNTER — Ambulatory Visit (INDEPENDENT_AMBULATORY_CARE_PROVIDER_SITE_OTHER): Payer: BC Managed Care – PPO | Admitting: Physician Assistant

## 2020-03-30 ENCOUNTER — Other Ambulatory Visit: Payer: Self-pay

## 2020-03-30 ENCOUNTER — Encounter: Payer: Self-pay | Admitting: Physician Assistant

## 2020-03-30 VITALS — BP 131/67 | HR 70 | Temp 98.1°F | Ht 67.0 in | Wt 253.6 lb

## 2020-03-30 DIAGNOSIS — Z1231 Encounter for screening mammogram for malignant neoplasm of breast: Secondary | ICD-10-CM | POA: Diagnosis not present

## 2020-03-30 DIAGNOSIS — Z0000 Encounter for general adult medical examination without abnormal findings: Secondary | ICD-10-CM

## 2020-03-30 DIAGNOSIS — Z1211 Encounter for screening for malignant neoplasm of colon: Secondary | ICD-10-CM

## 2020-03-30 DIAGNOSIS — E669 Obesity, unspecified: Secondary | ICD-10-CM

## 2020-03-30 DIAGNOSIS — Z23 Encounter for immunization: Secondary | ICD-10-CM

## 2020-03-30 DIAGNOSIS — R12 Heartburn: Secondary | ICD-10-CM

## 2020-03-30 NOTE — Progress Notes (Signed)
Complete physical exam   Patient: Leslie Mcdonald   DOB: 1965/10/05   54 y.o. Female  MRN: [id number] Visit Date: 03/30/2020  Today's healthcare provider: Trey Sailors, PA-C   Chief Complaint  Patient presents with  . Annual Exam  I,Tiani Stanbery M Jordana Dugue,acting as a scribe for Trey Sailors, PA-C.,have documented all relevant documentation on the behalf of Trey Sailors, PA-C,as directed by  Trey Sailors, PA-C while in the presence of Trey Sailors, PA-C.  Subjective    Leslie Mcdonald is a 54 y.o. female who presents today for a complete physical exam.  She reports consuming a general diet. The patient has a physically strenuous job, but has no regular exercise apart from work.  She generally feels well. She reports sleeping fairly well. She does not have additional problems to discuss today.  HPI    Obesity: Reports she eats well sometimes and then will eat poorly at others. For breakfast this morning she drank milk and had three donuts. Dinner tonight will be a sub. Eating outside of meals - may have handful of chips. Does not drink soda, sweet tea, or gatorade. Drinks three glasses of milk daily.   Wt Readings from Last 3 Encounters:  03/30/20 253 lb 9.6 oz (115 kg)  12/15/18 253 lb (114.8 kg)  12/07/18 251 lb (113.9 kg)   BP Readings from Last 3 Encounters:  03/30/20 131/67  12/15/18 (!) 138/98  12/07/18 132/81    Past Medical History:  Diagnosis Date  . Allergy   . Asthma   . Family history of ovarian cancer    genetic testing letter sent 9/20   Past Surgical History:  Procedure Laterality Date  . CESAREAN SECTION    . TUBAL LIGATION     Social History   Socioeconomic History  . Marital status: Married    Spouse name: Not on file  . Number of children: Not on file  . Years of education: Not on file  . Highest education level: Not on file  Occupational History  . Not on file  Tobacco Use  . Smoking status: Never Smoker  . Smokeless  tobacco: Never Used  Vaping Use  . Vaping Use: Never used  Substance and Sexual Activity  . Alcohol use: No  . Drug use: Not on file  . Sexual activity: Yes  Other Topics Concern  . Not on file  Social History Narrative  . Not on file   Social Determinants of Health   Financial Resource Strain:   . Difficulty of Paying Living Expenses: Not on file  Food Insecurity:   . Worried About Programme researcher, broadcasting/film/video in the Last Year: Not on file  . Ran Out of Food in the Last Year: Not on file  Transportation Needs:   . Lack of Transportation (Medical): Not on file  . Lack of Transportation (Non-Medical): Not on file  Physical Activity:   . Days of Exercise per Week: Not on file  . Minutes of Exercise per Session: Not on file  Stress:   . Feeling of Stress : Not on file  Social Connections:   . Frequency of Communication with Friends and Family: Not on file  . Frequency of Social Gatherings with Friends and Family: Not on file  . Attends Religious Services: Not on file  . Active Member of Clubs or Organizations: Not on file  . Attends Banker Meetings: Not on file  . Marital Status: Not  on file  Intimate Partner Violence:   . Fear of Current or Ex-Partner: Not on file  . Emotionally Abused: Not on file  . Physically Abused: Not on file  . Sexually Abused: Not on file   Family Status  Relation Name Status  . Mother  (Not Specified)  . Father  (Not Specified)   Family History  Problem Relation Age of Onset  . Ovarian cancer Mother 69  . Pancreatic cancer Father    No Known Allergies  Patient Care Team: Maryella Shivers as PCP - General (Physician Assistant)   Medications: Outpatient Medications Prior to Visit  Medication Sig  . loratadine (CLARITIN) 10 MG tablet Take by mouth.  . triamcinolone (NASACORT) 55 MCG/ACT AERO nasal inhaler Place into the nose.   No facility-administered medications prior to visit.    Review of Systems  Constitutional:  Negative.   HENT: Negative.   Eyes: Negative.   Respiratory: Negative.   Cardiovascular: Positive for leg swelling.  Gastrointestinal: Negative.   Endocrine: Negative.   Genitourinary: Negative.   Musculoskeletal: Positive for arthralgias and back pain.  Skin: Negative.   Allergic/Immunologic: Negative.   Neurological: Negative.   Hematological: Negative.   Psychiatric/Behavioral: Negative.       Objective    BP 131/67 (BP Location: Left Arm, Patient Position: Sitting, Cuff Size: Large)   Pulse 70   Temp 98.1 F (36.7 C) (Oral)   Ht 5\' 7"  (1.702 m)   Wt 253 lb 9.6 oz (115 kg)   SpO2 100%   BMI 39.72 kg/m    Physical Exam Constitutional:      Appearance: Normal appearance.  HENT:     Right Ear: Tympanic membrane, ear canal and external ear normal.     Left Ear: Tympanic membrane, ear canal and external ear normal.  Cardiovascular:     Rate and Rhythm: Normal rate and regular rhythm.     Pulses: Normal pulses.     Heart sounds: Normal heart sounds.  Pulmonary:     Effort: Pulmonary effort is normal.     Breath sounds: Normal breath sounds.  Abdominal:     General: Abdomen is flat. Bowel sounds are normal.     Palpations: Abdomen is soft.  Skin:    General: Skin is warm and dry.  Neurological:     General: No focal deficit present.     Mental Status: She is alert and oriented to person, place, and time.  Psychiatric:        Mood and Affect: Mood normal.        Behavior: Behavior normal.       Last depression screening scores PHQ 2/9 Scores 03/30/2020 12/07/2018 10/22/2018  PHQ - 2 Score 0 0 0  PHQ- 9 Score 1 0 1   Last fall risk screening Fall Risk  03/30/2020  Falls in the past year? 0  Number falls in past yr: 0  Injury with Fall? 0  Risk for fall due to : No Fall Risks  Follow up Falls evaluation completed   Last Audit-C alcohol use screening Alcohol Use Disorder Test (AUDIT) 03/30/2020  1. How often do you have a drink containing alcohol? 1  2.  How many drinks containing alcohol do you have on a typical day when you are drinking? 0  3. How often do you have six or more drinks on one occasion? 0  AUDIT-C Score 1   A score of 3 or more in women, and 4 or more  in men indicates increased risk for alcohol abuse, EXCEPT if all of the points are from question 1   No results found for any visits on 03/30/20.  Assessment & Plan    Routine Health Maintenance and Physical Exam  Exercise Activities and Dietary recommendations Goals   None     Immunization History  Administered Date(s) Administered  . Influenza Split 02/16/2014  . Moderna SARS-COVID-2 Vaccination 07/13/2019, 08/10/2019  . Td 12/07/2018    Health Maintenance  Topic Date Due  . Hepatitis C Screening  Never done  . MAMMOGRAM  Never done  . COLONOSCOPY  Never done  . INFLUENZA VACCINE  07/26/2020 (Originally 11/27/2019)  . PAP SMEAR-Modifier  12/03/2023  . TETANUS/TDAP  12/06/2028  . COVID-19 Vaccine  Completed  . HIV Screening  Completed    Discussed health benefits of physical activity, and encouraged her to engage in regular exercise appropriate for her age and condition.     No follow-ups on file.     1. Annual physical exam  - TSH - Lipid panel - Comprehensive metabolic panel - CBC with Differential/Platelet - Hepatitis C antibody  2. Colon cancer screening  - Ambulatory referral to Gastroenterology  3. Encounter for screening mammogram for malignant neoplasm of breast  - MM Digital Screening; Future  4. Need for shingles vaccine  Counseled on eligibility for shingles vaccine and to call insurance.   5. Heartburn  Discussed avoiding triggers, weight loss efforts, not eating before bed and pepcid 20 mg 30 minutes before a meal on an empty stomach.  6. Obesity  Discussed importance of healthy weight management Discussed diet and exercise   Maryella Shivers  [facility] [phone] (phone) [phone]  (fax)  [facility]

## 2020-03-30 NOTE — Patient Instructions (Addendum)
Ask insurance company about Shingrix for shingles   Heartburn  Health Maintenance, Female Adopting a healthy lifestyle and getting preventive care are important in promoting health and wellness. Ask your health care provider about:  The right schedule for you to have regular tests and exams.  Things you can do on your own to prevent diseases and keep yourself healthy. What should I know about diet, weight, and exercise? Eat a healthy diet   Eat a diet that includes plenty of vegetables, fruits, low-fat dairy products, and lean protein.  Do not eat a lot of foods that are high in solid fats, added sugars, or sodium. Maintain a healthy weight Body mass index (BMI) is used to identify weight problems. It estimates body fat based on height and weight. Your health care provider can help determine your BMI and help you achieve or maintain a healthy weight. Get regular exercise Get regular exercise. This is one of the most important things you can do for your health. Most adults should:  Exercise for at least 150 minutes each week. The exercise should increase your heart rate and make you sweat (moderate-intensity exercise).  Do strengthening exercises at least twice a week. This is in addition to the moderate-intensity exercise.  Spend less time sitting. Even light physical activity can be beneficial. Watch cholesterol and blood lipids Have your blood tested for lipids and cholesterol at 55 years of age, then have this test every 5 years. Have your cholesterol levels checked more often if:  Your lipid or cholesterol levels are high.  You are older than 55 years of age.  You are at high risk for heart disease. What should I know about cancer screening? Depending on your health history and family history, you may need to have cancer screening at various ages. This may include screening for:  Breast cancer.  Cervical cancer.  Colorectal cancer.  Skin cancer.  Lung cancer. What  should I know about heart disease, diabetes, and high blood pressure? Blood pressure and heart disease  High blood pressure causes heart disease and increases the risk of stroke. This is more likely to develop in people who have high blood pressure readings, are of African descent, or are overweight.  Have your blood pressure checked: ? Every 3-5 years if you are 71-13 years of age. ? Every year if you are 34 years old or older. Diabetes Have regular diabetes screenings. This checks your fasting blood sugar level. Have the screening done:  Once every three years after age 38 if you are at a normal weight and have a low risk for diabetes.  More often and at a younger age if you are overweight or have a high risk for diabetes. What should I know about preventing infection? Hepatitis B If you have a higher risk for hepatitis B, you should be screened for this virus. Talk with your health care provider to find out if you are at risk for hepatitis B infection. Hepatitis C Testing is recommended for:  Everyone born from [birth year] through 1966.  Anyone with known risk factors for hepatitis C. Sexually transmitted infections (STIs)  Get screened for STIs, including gonorrhea and chlamydia, if: ? You are sexually active and are younger than 55 years of age. ? You are older than 55 years of age and your health care provider tells you that you are at risk for this type of infection. ? Your sexual activity has changed since you were last screened, and you are at increased  risk for chlamydia or gonorrhea. Ask your health care provider if you are at risk.  Ask your health care provider about whether you are at high risk for HIV. Your health care provider may recommend a prescription medicine to help prevent HIV infection. If you choose to take medicine to prevent HIV, you should first get tested for HIV. You should then be tested every 3 months for as long as you are taking the medicine. Pregnancy  If  you are about to stop having your period (premenopausal) and you may become pregnant, seek counseling before you get pregnant.  Take 400 to 800 micrograms (mcg) of folic acid every day if you become pregnant.  Ask for birth control (contraception) if you want to prevent pregnancy. Osteoporosis and menopause Osteoporosis is a disease in which the bones lose minerals and strength with aging. This can result in bone fractures. If you are 22 years old or older, or if you are at risk for osteoporosis and fractures, ask your health care provider if you should:  Be screened for bone loss.  Take a calcium or vitamin D supplement to lower your risk of fractures.  Be given hormone replacement therapy (HRT) to treat symptoms of menopause. Follow these instructions at home: Lifestyle  Do not use any products that contain nicotine or tobacco, such as cigarettes, e-cigarettes, and chewing tobacco. If you need help quitting, ask your health care provider.  Do not use street drugs.  Do not share needles.  Ask your health care provider for help if you need support or information about quitting drugs. Alcohol use  Do not drink alcohol if: ? Your health care provider tells you not to drink. ? You are pregnant, may be pregnant, or are planning to become pregnant.  If you drink alcohol: ? Limit how much you use to 0-1 drink a day. ? Limit intake if you are breastfeeding.  Be aware of how much alcohol is in your drink. In the [place] one drink equals one 12 oz bottle of beer (355 mL), one 5 oz glass of wine (148 mL), or one 1 oz glass of hard liquor (44 mL). General instructions  Schedule regular health, dental, and eye exams.  Stay current with your vaccines.  Tell your health care provider if: ? You often feel depressed. ? You have ever been abused or do not feel safe at home. Summary  Adopting a healthy lifestyle and getting preventive care are important in promoting health and  wellness.  Follow your health care provider's instructions about healthy diet, exercising, and getting tested or screened for diseases.  Follow your health care provider's instructions on monitoring your cholesterol and blood pressure. This information is not intended to replace advice given to you by your health care provider. Make sure you discuss any questions you have with your health care provider. Document Revised: 04/07/2018 Document Reviewed: 04/07/2018 Elsevier Patient Education  2020 [employer].

## 2020-03-31 LAB — CBC WITH DIFFERENTIAL/PLATELET
Basophils Absolute: 0 10*3/uL (ref 0.0–0.2)
Basos: 0 %
EOS (ABSOLUTE): 0.2 10*3/uL (ref 0.0–0.4)
Eos: 3 %
Hematocrit: 41.5 % (ref 34.0–46.6)
Hemoglobin: 14.2 g/dL (ref 11.1–15.9)
Immature Grans (Abs): 0 10*3/uL (ref 0.0–0.1)
Immature Granulocytes: 0 %
Lymphocytes Absolute: 1.9 10*3/uL (ref 0.7–3.1)
Lymphs: 24 %
MCH: 30.1 pg (ref 26.6–33.0)
MCHC: 34.2 g/dL (ref 31.5–35.7)
MCV: 88 fL (ref 79–97)
Monocytes Absolute: 0.8 10*3/uL (ref 0.1–0.9)
Monocytes: 10 %
Neutrophils Absolute: 4.7 10*3/uL (ref 1.4–7.0)
Neutrophils: 63 %
Platelets: 235 10*3/uL (ref 150–450)
RBC: 4.71 x10E6/uL (ref 3.77–5.28)
RDW: 12 % (ref 11.7–15.4)
WBC: 7.6 10*3/uL (ref 3.4–10.8)

## 2020-03-31 LAB — COMPREHENSIVE METABOLIC PANEL
ALT: 26 IU/L (ref 0–32)
AST: 24 IU/L (ref 0–40)
Albumin/Globulin Ratio: 1.7 (ref 1.2–2.2)
Albumin: 4.3 g/dL (ref 3.8–4.9)
Alkaline Phosphatase: 97 IU/L (ref 44–121)
BUN/Creatinine Ratio: 13 (ref 9–23)
BUN: 10 mg/dL (ref 6–24)
Bilirubin Total: 0.3 mg/dL (ref 0.0–1.2)
CO2: 22 mmol/L (ref 20–29)
Calcium: 9.2 mg/dL (ref 8.7–10.2)
Chloride: 101 mmol/L (ref 96–106)
Creatinine, Ser: 0.76 mg/dL (ref 0.57–1.00)
GFR calc Af Amer: 102 mL/min/{1.73_m2} (ref >59)
GFR calc non Af Amer: 89 mL/min/{1.73_m2} (ref >59)
Globulin, Total: 2.6 g/dL (ref 1.5–4.5)
Glucose: 86 mg/dL (ref 65–99)
Potassium: 4.6 mmol/L (ref 3.5–5.2)
Sodium: 141 mmol/L (ref 134–144)
Total Protein: 6.9 g/dL (ref 6.0–8.5)

## 2020-03-31 LAB — LIPID PANEL
Chol/HDL Ratio: 2.8 ratio (ref 0.0–4.4)
Cholesterol, Total: 129 mg/dL (ref 100–199)
HDL: 46 mg/dL (ref >39)
LDL Chol Calc (NIH): 73 mg/dL (ref 0–99)
Triglycerides: 43 mg/dL (ref 0–149)
VLDL Cholesterol Cal: 10 mg/dL (ref 5–40)

## 2020-03-31 LAB — HEPATITIS C ANTIBODY: Hep C Virus Ab: <0.1 s/co ratio (ref 0.0–0.9)

## 2020-03-31 LAB — TSH: TSH: 0.679 u[IU]/mL (ref 0.450–4.500)

## 2020-04-25 ENCOUNTER — Encounter: Payer: Self-pay | Admitting: Physician Assistant

## 2020-04-25 ENCOUNTER — Telehealth (INDEPENDENT_AMBULATORY_CARE_PROVIDER_SITE_OTHER): Payer: BC Managed Care – PPO | Admitting: Physician Assistant

## 2020-04-25 ENCOUNTER — Other Ambulatory Visit: Payer: Self-pay

## 2020-04-25 ENCOUNTER — Ambulatory Visit
Admission: RE | Admit: 2020-04-25 | Discharge: 2020-04-25 | Disposition: A | Discharge status: Home / Self Care | Payer: BC Managed Care – PPO | Source: Ambulatory Visit | Attending: Physician Assistant | Admitting: Physician Assistant

## 2020-04-25 DIAGNOSIS — M7989 Other specified soft tissue disorders: Secondary | ICD-10-CM | POA: Insufficient documentation

## 2020-04-25 DIAGNOSIS — R600 Localized edema: Secondary | ICD-10-CM | POA: Diagnosis not present

## 2020-04-25 NOTE — Telephone Encounter (Signed)
Can we schedule patient an appointment? Thanks.

## 2020-04-25 NOTE — Progress Notes (Signed)
MyChart Video Visit    Virtual Visit via Video Note   This visit type was conducted due to national recommendations for restrictions regarding the COVID-19 Pandemic (e.g. social distancing) in an effort to limit this patient's exposure and mitigate transmission in our community. This patient is at least at moderate risk for complications without adequate follow up. This format is felt to be most appropriate for this patient at this time. Physical exam was limited by quality of the video and audio technology used for the visit.   Patient location: Home Provider location: Office   I discussed the limitations of evaluation and management by telemedicine and the availability of in person appointments. The patient expressed understanding and agreed to proceed.  Patient: Leslie Mcdonald   DOB: October 05, 1965   54 y.o. Female  MRN: [id number] Visit Date: 04/25/2020  Today's healthcare provider: Trey Sailors, PA-C   Chief Complaint  Patient presents with  . Leg Swelling   Subjective    HPI   She reports her left leg from her hip down is swollen, ongoing for one day. She reports some improvement in swelling today at work. She reports the leg is slightly red. She reports right leg is normal. She reports her left knee is somewhat sore. She denies fevers, chills, nausea, and vomiting. She has never had a blood clot. She is not having any shortness of breath. She denies chest pain. She did take some tylenol which helped aching of knee. She does typically have swelling of her     Medications: Outpatient Medications Prior to Visit  Medication Sig  . loratadine (CLARITIN) 10 MG tablet Take by mouth.  . triamcinolone (NASACORT) 55 MCG/ACT AERO nasal inhaler Place into the nose.   No facility-administered medications prior to visit.    Review of Systems  Constitutional: Negative.   Respiratory: Negative for cough and shortness of breath.   Cardiovascular: Positive for leg swelling.  Negative for chest pain and palpitations.  Musculoskeletal: Negative.   Neurological: Negative for light-headedness and headaches.      Objective    There were no vitals taken for this visit.   Physical Exam Constitutional:      Appearance: Normal appearance.  Pulmonary:     Effort: No respiratory distress.  Musculoskeletal:     Right lower leg: No edema.     Left lower leg: Edema present.     Comments: Left leg grossly edematous, nonpitting edema up to level of thigh. Right leg normal in appearance.   Neurological:     Mental Status: She is alert.        Assessment & Plan    1. Left leg swelling  Most immediately concerning for DVT, r/o with ultrasound as below. If positive, start on Xarelto and refer to vascular/hematology.   If negative, consider alternative sources of swelling including venous dysfunction, heart failure, injury, arthritis, etc.   [place] negative for DVT. Discussed options including CXR, BNP, possible vascular referral. Patient will message back with how she would like to move forward.   - US Venous Img Lower Unilateral Left (DVT); Future   No follow-ups on file.     I discussed the assessment and treatment plan with the patient. The patient was provided an opportunity to ask questions and all were answered. The patient agreed with the plan and demonstrated an understanding of the instructions.   The patient was advised to call back or seek an in-person evaluation if the symptoms worsen or  if the condition fails to improve as anticipated.  ITrey Sailors, PA-C, have reviewed all documentation for this visit. The documentation on 04/25/20 for the exam, diagnosis, procedures, and orders are all accurate and complete.  The entirety of the information documented in the History of Present Illness, Review of Systems and Physical Exam were personally obtained by me. Portions of this information were initially documented by Anson [place], CMA and  reviewed by me for thoroughness and accuracy.   I spent 30 minutes dedicated to the care of this patient on the date of this encounter to include pre-visit review of records, face-to-face time with the patient discussing dvt, and post visit ordering of testing.   Maryella Shivers [facility] [phone] (phone) [phone] (fax)  [facility]

## 2020-04-25 NOTE — Telephone Encounter (Signed)
Patient was seen as a virtual visit today.

## 2020-04-25 NOTE — Telephone Encounter (Signed)
You do not have any openings this week. Can they wait until next week? Please advise. Thanks!

## 2020-11-30 DIAGNOSIS — M654 Radial styloid tenosynovitis [de Quervain]: Secondary | ICD-10-CM | POA: Diagnosis not present

## 2020-12-13 DIAGNOSIS — M654 Radial styloid tenosynovitis [de Quervain]: Secondary | ICD-10-CM | POA: Diagnosis not present

## 2021-04-01 ENCOUNTER — Encounter: Payer: Self-pay | Admitting: Physician Assistant

## 2022-01-23 DIAGNOSIS — M170 Bilateral primary osteoarthritis of knee: Secondary | ICD-10-CM | POA: Diagnosis not present

## 2022-07-12 IMAGING — US US EXTREM LOW VENOUS*L*
1 series · 14 of 24 positions shown · non-contrast
Comparison: None.

CLINICAL DATA: Left leg swelling.

EXAM:
LEFT LOWER EXTREMITY VENOUS DOPPLER ULTRASOUND
TECHNIQUE: Gray-scale sonography with compression, as well as color and duplex
ultrasound, were performed to evaluate the deep venous system(s)
from the level of the common femoral vein through the popliteal and
proximal calf veins.

[Series 1: us venous img lower uni left (dvt) · portal-venous · 14 of 34 slices shown]
[im 1/34]
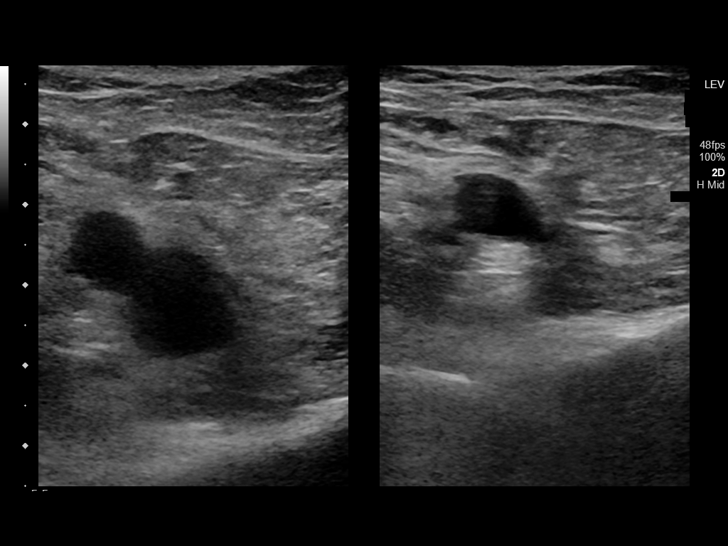
[im 3/34]
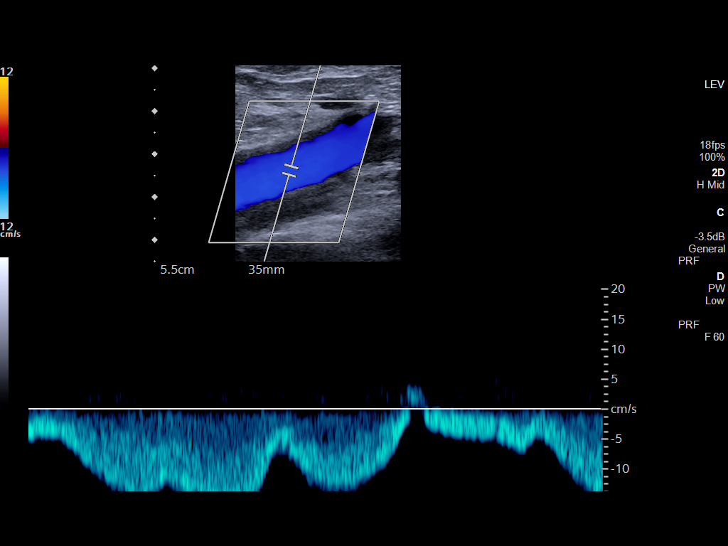
[im 6/34]
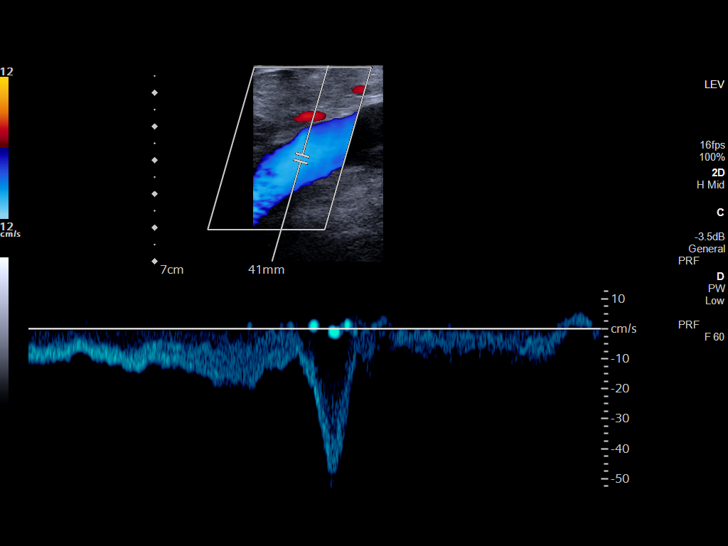
[im 9/34]
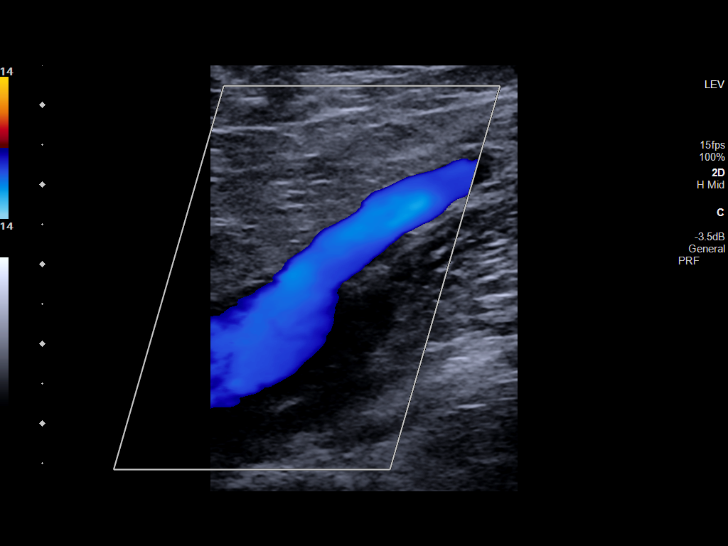
[im 11/34]
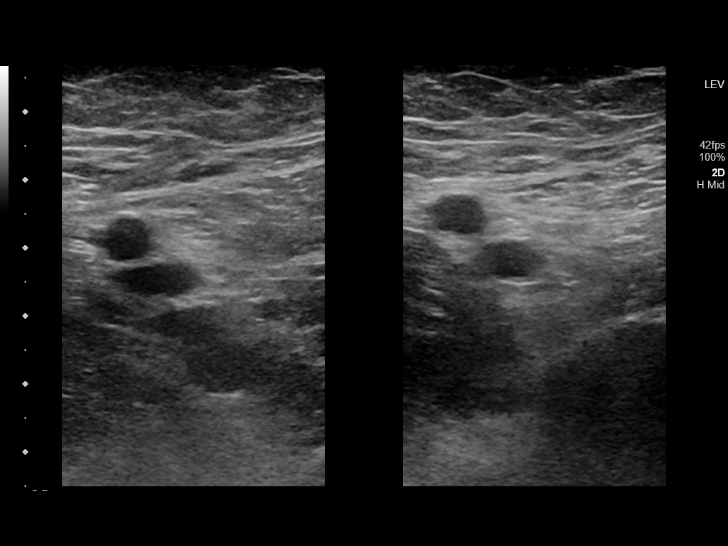
[im 13/34]
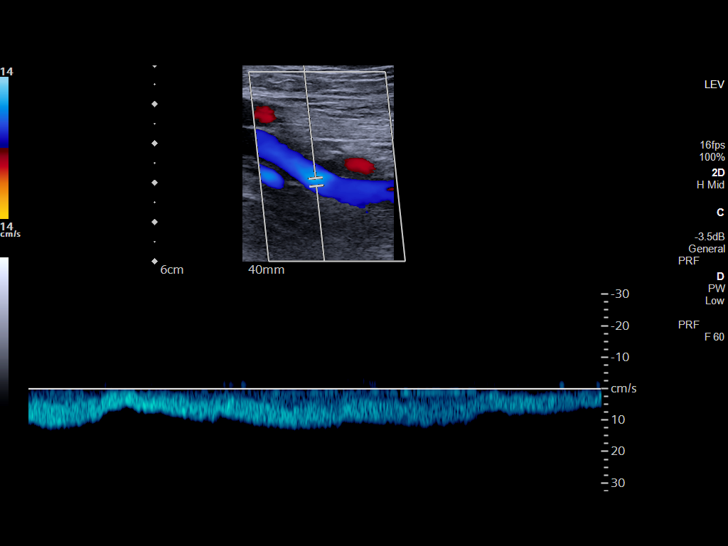
[im 16/34]
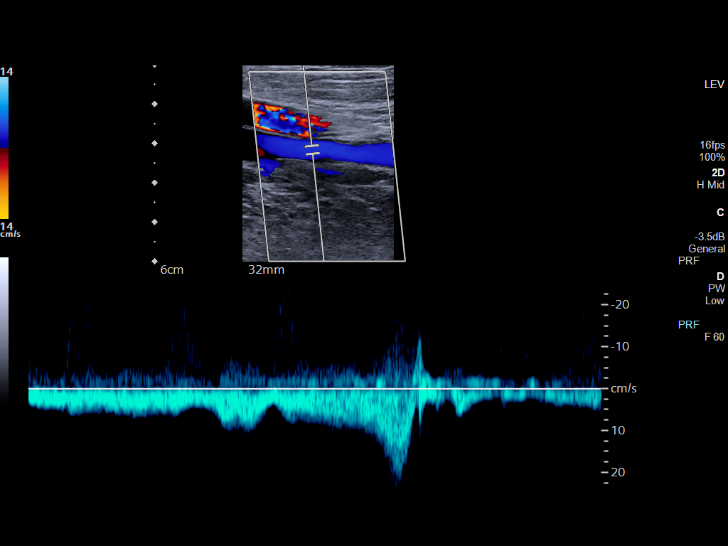
[im 18/34]
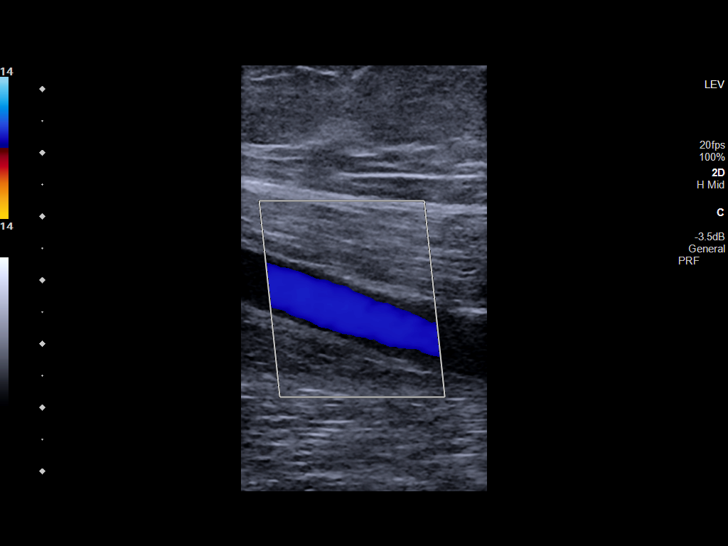
[im 21/34]
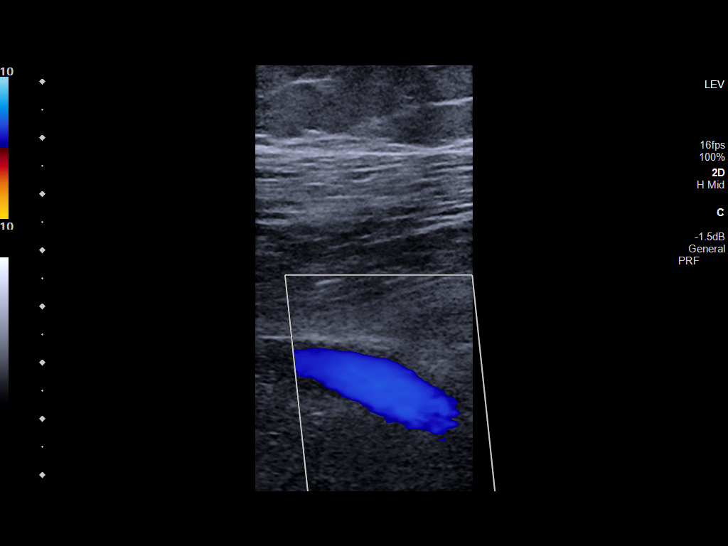
[im 23/34]
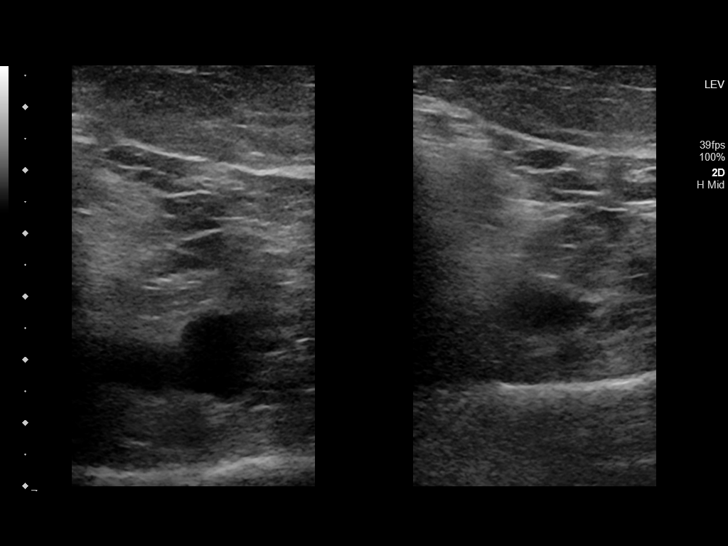
[im 26/34]
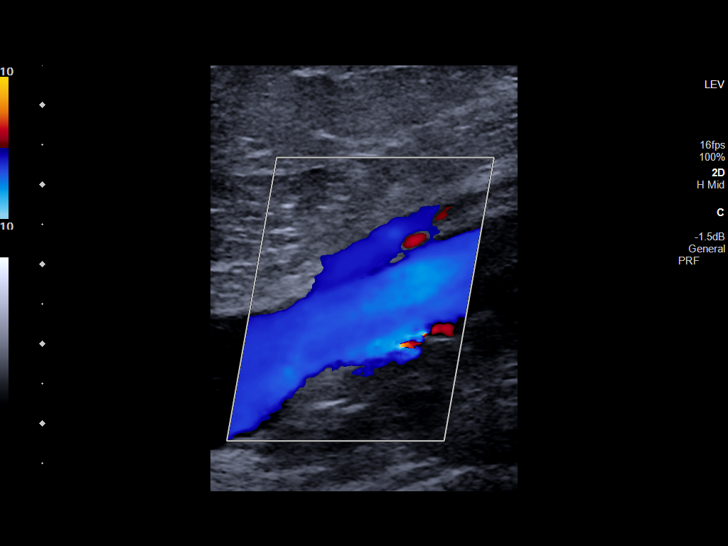
[im 28/34]
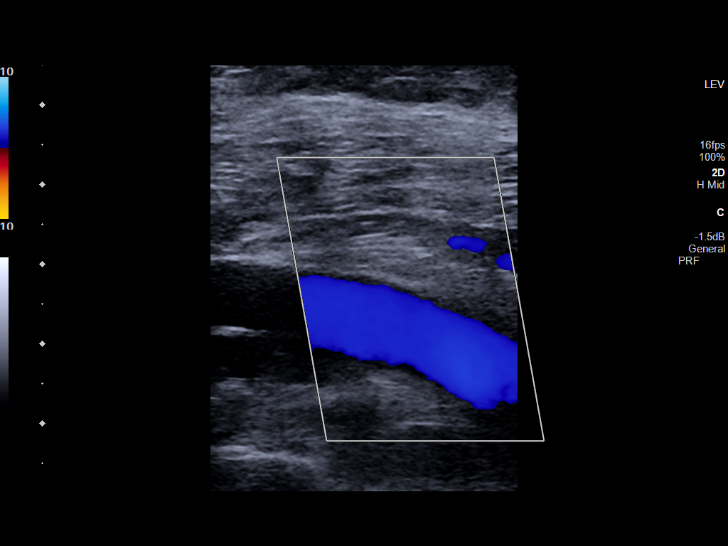
[im 31/34]
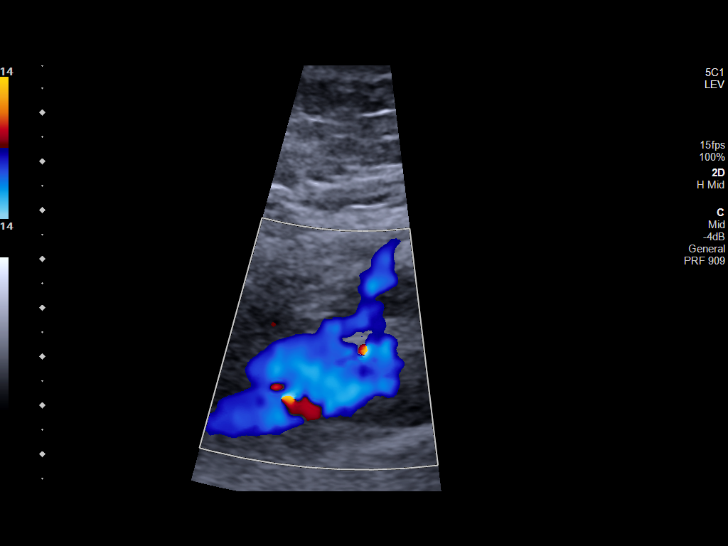
[im 34/34]
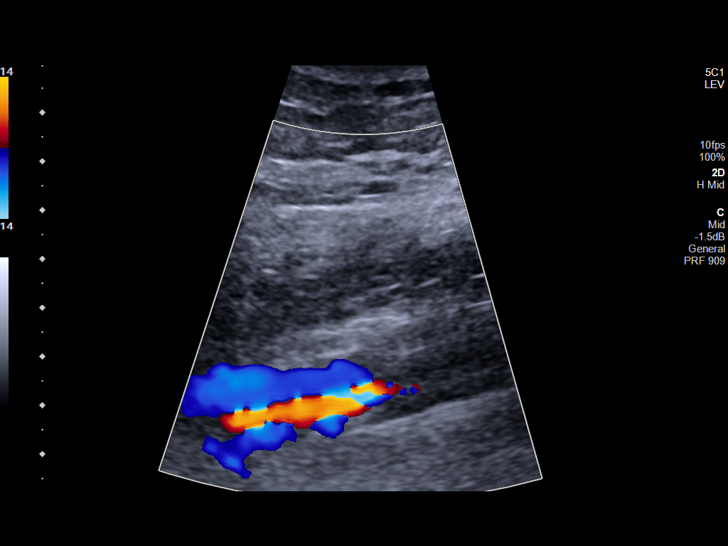

[14 of 24 positions shown; findings below may reference images not displayed]

FINDINGS: VENOUS

Normal compressibility of the common femoral, superficial femoral,
and popliteal veins, as well as the visualized calf veins.
Visualized portions of profunda femoral vein and great saphenous
vein unremarkable. No filling defects to suggest DVT on grayscale or
color Doppler imaging. Doppler waveforms show normal direction of
venous flow, normal respiratory plasticity and response to
augmentation.

Limited views of the contralateral common femoral vein are
unremarkable.

OTHER

Subcutaneous edema.

Limitations: Limited visualization of the calf veins secondary to
edema.
IMPRESSION: Negative for DVT within the limitations detailed above.

## 2023-06-08 ENCOUNTER — Other Ambulatory Visit: Payer: Self-pay | Admitting: Chiropractor

## 2023-06-08 ENCOUNTER — Ambulatory Visit
Admission: RE | Admit: 2023-06-08 | Discharge: 2023-06-08 | Disposition: A | Discharge status: Home / Self Care | Payer: BC Managed Care – PPO | Source: Ambulatory Visit | Attending: Chiropractor | Admitting: Chiropractor

## 2023-06-08 DIAGNOSIS — M79671 Pain in right foot: Secondary | ICD-10-CM | POA: Diagnosis not present

## 2023-06-08 DIAGNOSIS — M79672 Pain in left foot: Secondary | ICD-10-CM

## 2023-06-08 DIAGNOSIS — M19071 Primary osteoarthritis, right ankle and foot: Secondary | ICD-10-CM | POA: Diagnosis not present

## 2023-06-08 DIAGNOSIS — M19072 Primary osteoarthritis, left ankle and foot: Secondary | ICD-10-CM | POA: Diagnosis not present

## 2023-07-16 ENCOUNTER — Encounter: Payer: Self-pay | Admitting: Family

## 2023-07-16 ENCOUNTER — Ambulatory Visit: Admitting: Family

## 2023-07-16 VITALS — BP 136/76 | HR 76 | Temp 97.8°F | Ht 67.0 in | Wt 252.8 lb

## 2023-07-16 DIAGNOSIS — Z1231 Encounter for screening mammogram for malignant neoplasm of breast: Secondary | ICD-10-CM

## 2023-07-16 DIAGNOSIS — R030 Elevated blood-pressure reading, without diagnosis of hypertension: Secondary | ICD-10-CM

## 2023-07-16 DIAGNOSIS — Z1211 Encounter for screening for malignant neoplasm of colon: Secondary | ICD-10-CM

## 2023-07-16 DIAGNOSIS — Z7689 Persons encountering health services in other specified circumstances: Secondary | ICD-10-CM | POA: Insufficient documentation

## 2023-07-16 NOTE — Progress Notes (Signed)
 Assessment & Plan:  Encounter to establish care Assessment & Plan: Reviewed family, surgical, medical and social history. Discussed family history of malignancy.  Referral to genetics.  Screening labs ordered today.  Mammogram ordered. Referral for colonoscopy  Orders: -     Ambulatory referral to Genetics -     CBC with Differential/Platelet; Future -     Comprehensive metabolic panel; Future -     Hemoglobin A1c; Future -     Lipid panel; Future -     TSH; Future -     VITAMIN D 25 Hydroxy (Vit-D Deficiency, Fractures); Future  Encounter for screening mammogram for malignant neoplasm of breast -     3D Screening Mammogram, Left and Right; Future  Screening for colon cancer -     Ambulatory referral to Gastroenterology  Elevated blood pressure reading Assessment & Plan: Blood pressure slightly elevated today.  Discussed monitoring at home. Will follow.       Return precautions given.   Risks, benefits, and alternatives of the medications and treatment plan prescribed today were discussed, and patient expressed understanding.   Education regarding symptom management and diagnosis given to patient on AVS either electronically or printed.  Return in about 6 months (around 01/16/2024) for Fasting labs in 2-3 weeks.  Rennie Plowman, FNP  Subjective:    Patient ID: Leslie Mcdonald, female    DOB: Oct 21, 1965, 58 y.o.   MRN: [id number]  CC: Leslie Mcdonald is a 58 y.o. female who presents today to establish care.    HPI: Overall feels well today.  No new complaints   she has chronic low back pain and follows with chiropractor for adjustment, dry needling with improvement.  Denies urinary or fecal incontinence, numbness or weakness in legs, saddle anesthesia   She has chronic BLE for years which is unchanged  Family history of liver, pancreatic and ovarian cancer  Due colonoscopy and mammogram   Last seen [facility] medicine for CPE by Beverly Gust  She appointment tomorrow with orthopedic for BL foot pain.  Allergies: Patient has no known allergies. Current Outpatient Medications on File Prior to Visit  Medication Sig Dispense Refill   loratadine (CLARITIN) 10 MG tablet Take by mouth.     triamcinolone (NASACORT) 55 MCG/ACT AERO nasal inhaler Place into the nose.     No current facility-administered medications on file prior to visit.    Review of Systems  Constitutional:  Negative for chills and fever.  Respiratory:  Negative for cough.   Cardiovascular:  Negative for chest pain and palpitations.  Gastrointestinal:  Negative for nausea and vomiting.      Objective:    BP 136/76   Pulse 76   Temp 97.8 F (36.6 C) (Oral)   Ht 5\' 7"  (1.702 m)   Wt 252 lb 12.8 oz (114.7 kg)   SpO2 98%   BMI 39.59 kg/m  BP Readings from Last 3 Encounters:  07/16/23 136/76  03/30/20 131/67  12/15/18 (!) 138/98   Wt Readings from Last 3 Encounters:  07/16/23 252 lb 12.8 oz (114.7 kg)  03/30/20 253 lb 9.6 oz (115 kg)  12/15/18 253 lb (114.8 kg)    Physical Exam Vitals reviewed.  Constitutional:      Appearance: She is well-developed.  Eyes:     Conjunctiva/sclera: Conjunctivae normal.  Cardiovascular:     Rate and Rhythm: Normal rate and regular rhythm.     Pulses: Normal pulses.     Heart sounds: Normal  heart sounds.  Pulmonary:     Effort: Pulmonary effort is normal.     Breath sounds: Normal breath sounds. No wheezing, rhonchi or rales.  Skin:    General: Skin is warm and dry.  Neurological:     Mental Status: She is alert.  Psychiatric:        Speech: Speech normal.        Behavior: Behavior normal.        Thought Content: Thought content normal.

## 2023-07-16 NOTE — Assessment & Plan Note (Signed)
 Reviewed family, surgical, medical and social history. Discussed family history of malignancy.  Referral to genetics.  Screening labs ordered today.  Mammogram ordered. Referral for colonoscopy

## 2023-07-16 NOTE — Patient Instructions (Addendum)
 Monitor blood pressure at home and me 5-6 reading on separate days. Goal is less than 120/80, based on newest guidelines, however we certainly want to be less than 130/80;  if persistently higher, please make sooner follow up appointment so we can recheck you blood pressure and manage/ adjust medications.   Please call  and schedule your 3D mammogram and /or bone density scan as we discussed.   [facility]  ( new location in 2023)  [address], [place], [place] [address]  [place], [place]  [phone]   Referral for colonoscopy and to genetics  Let us know if you dont hear back within a week in regards to an appointment being scheduled.   So that you are aware, if you are [facility] MyChart user , please pay attention to your MyChart messages as you may receive a MyChart message with a phone number to call and schedule this test/appointment own your own from our referral coordinator. This is a new process so I do not want you to miss this message.  If you are not a MyChart user, you will receive a phone call.    Managing Your Hypertension Hypertension, also called high blood pressure, is when the force of the blood pressing against the walls of the arteries is too strong. Arteries are blood vessels that carry blood from your heart throughout your body. Hypertension forces the heart to work harder to pump blood and may cause the arteries to become narrow or stiff. Understanding blood pressure readings A blood pressure reading includes a higher number over a lower number: The first, or top, number is called the systolic pressure. It is a measure of the pressure in your arteries as your heart beats. The second, or bottom number, is called the diastolic pressure. It is a measure of the pressure in your arteries as the heart relaxes. For most people, a normal blood pressure is below 120/80. Your personal target blood pressure may vary depending on your medical conditions, your  age, and other factors. Blood pressure is classified into four stages. Based on your blood pressure reading, your health care provider may use the following stages to determine what type of treatment you need, if any. Systolic pressure and diastolic pressure are measured in a unit called millimeters of mercury (mmHg). Normal Systolic pressure: below 120. Diastolic pressure: below 80. Elevated Systolic pressure: 120-129. Diastolic pressure: below 80. Hypertension stage 1 Systolic pressure: 130-139. Diastolic pressure: 80-89. Hypertension stage 2 Systolic pressure: 140 or above. Diastolic pressure: 90 or above. How can this condition affect me? Managing your hypertension is very important. Over time, hypertension can damage the arteries and decrease blood flow to parts of the body, including the brain, heart, and kidneys. Having untreated or uncontrolled hypertension can lead to: A heart attack. A stroke. A weakened blood vessel (aneurysm). Heart failure. Kidney damage. Eye damage. Memory and concentration problems. Vascular dementia. What actions can I take to manage this condition? Hypertension can be managed by making lifestyle changes and possibly by taking medicines. Your health care provider will help you make a plan to bring your blood pressure within a normal range. You may be referred for counseling on a healthy diet and physical activity. Nutrition  Eat a diet that is high in fiber and potassium, and low in salt (sodium), added sugar, and fat. An example eating plan is called the DASH diet. DASH stands for Dietary Approaches to Stop Hypertension. To eat this way: Eat plenty of fresh  fruits and vegetables. Try to fill one-half of your plate at each meal with fruits and vegetables. Eat whole grains, such as whole-wheat pasta, brown rice, or whole-grain bread. Fill about one-fourth of your plate with whole grains. Eat low-fat dairy products. Avoid fatty cuts of meat, processed or  cured meats, and poultry with skin. Fill about one-fourth of your plate with lean proteins such as fish, chicken without skin, beans, eggs, and tofu. Avoid pre-made and processed foods. These tend to be higher in sodium, added sugar, and fat. Reduce your daily sodium intake. Many people with hypertension should eat less than 1,500 mg of sodium a day. Lifestyle  Work with your health care provider to maintain a healthy body weight or to lose weight. Ask what an ideal weight is for you. Get at least 30 minutes of exercise that causes your heart to beat faster (aerobic exercise) most days of the week. Activities may include walking, swimming, or biking. Include exercise to strengthen your muscles (resistance exercise), such as weight lifting, as part of your weekly exercise routine. Try to do these types of exercises for 30 minutes at least 3 days a week. Do not use any products that contain nicotine or tobacco. These products include cigarettes, chewing tobacco, and vaping devices, such as e-cigarettes. If you need help quitting, ask your health care provider. Control any long-term (chronic) conditions you have, such as high cholesterol or diabetes. Identify your sources of stress and find ways to manage stress. This may include meditation, deep breathing, or making time for fun activities. Alcohol use Do not drink alcohol if: Your health care provider tells you not to drink. You are pregnant, may be pregnant, or are planning to become pregnant. If you drink alcohol: Limit how much you have to: 0-1 drink a day for women. 0-2 drinks a day for men. Know how much alcohol is in your drink. In the [place] one drink equals one 12 oz bottle of beer (355 mL), one 5 oz glass of wine (148 mL), or one 1 oz glass of hard liquor (44 mL). Medicines Your health care provider may prescribe medicine if lifestyle changes are not enough to get your blood pressure under control and if: Your systolic blood pressure is  130 or higher. Your diastolic blood pressure is 80 or higher. Take medicines only as told by your health care provider. Follow the directions carefully. Blood pressure medicines must be taken as told by your health care provider. The medicine does not work as well when you skip doses. Skipping doses also puts you at risk for problems. Monitoring Before you monitor your blood pressure: Do not smoke, drink caffeinated beverages, or exercise within 30 minutes before taking a measurement. Use the bathroom and empty your bladder (urinate). Sit quietly for at least 5 minutes before taking measurements. Monitor your blood pressure at home as told by your health care provider. To do this: Sit with your back straight and supported. Place your feet flat on the floor. Do not cross your legs. Support your arm on a flat surface, such as a table. Make sure your upper arm is at heart level. Each time you measure, take two or three readings one minute apart and record the results. You may also need to have your blood pressure checked regularly by your health care provider. General information Talk with your health care provider about your diet, exercise habits, and other lifestyle factors that may be contributing to hypertension. Review all the medicines you take  with your health care provider because there may be side effects or interactions. Keep all follow-up visits. Your health care provider can help you create and adjust your plan for managing your high blood pressure. Where to find more information National Heart, Lung, and Blood Institute: PopSteam.is American Heart Association: www.heart.org Contact a health care provider if: You think you are having a reaction to medicines you have taken. You have repeated (recurrent) headaches. You feel dizzy. You have swelling in your ankles. You have trouble with your vision. Get help right away if: You develop a severe headache or confusion. You have  unusual weakness or numbness, or you feel faint. You have severe pain in your chest or abdomen. You vomit repeatedly. You have trouble breathing. These symptoms may be an emergency. Get help right away. Call 911. Do not wait to see if the symptoms will go away. Do not drive yourself to the hospital. Summary Hypertension is when the force of blood pumping through your arteries is too strong. If this condition is not controlled, it may put you at risk for serious complications. Your personal target blood pressure may vary depending on your medical conditions, your age, and other factors. For most people, a normal blood pressure is less than 120/80. Hypertension is managed by lifestyle changes, medicines, or both. Lifestyle changes to help manage hypertension include losing weight, eating a healthy, low-sodium diet, exercising more, stopping smoking, and limiting alcohol. This information is not intended to replace advice given to you by your health care provider. Make sure you discuss any questions you have with your health care provider. Document Revised: 12/27/2020 Document Reviewed: 12/27/2020 Elsevier Patient Education  2024 [employer].

## 2023-07-16 NOTE — Assessment & Plan Note (Signed)
 Blood pressure slightly elevated today.  Discussed monitoring at home. Will follow.

## 2023-07-17 DIAGNOSIS — M76822 Posterior tibial tendinitis, left leg: Secondary | ICD-10-CM | POA: Diagnosis not present

## 2023-07-17 DIAGNOSIS — M19071 Primary osteoarthritis, right ankle and foot: Secondary | ICD-10-CM | POA: Diagnosis not present

## 2023-07-29 ENCOUNTER — Encounter: Payer: Self-pay | Admitting: *Deleted

## 2023-08-06 ENCOUNTER — Other Ambulatory Visit

## 2023-08-06 DIAGNOSIS — Z131 Encounter for screening for diabetes mellitus: Secondary | ICD-10-CM

## 2023-08-06 DIAGNOSIS — Z6839 Body mass index (BMI) 39.0-39.9, adult: Secondary | ICD-10-CM | POA: Diagnosis not present

## 2023-08-06 DIAGNOSIS — Z136 Encounter for screening for cardiovascular disorders: Secondary | ICD-10-CM

## 2023-08-06 DIAGNOSIS — Z0000 Encounter for general adult medical examination without abnormal findings: Secondary | ICD-10-CM

## 2023-08-06 DIAGNOSIS — Z1321 Encounter for screening for nutritional disorder: Secondary | ICD-10-CM | POA: Diagnosis not present

## 2023-08-06 DIAGNOSIS — Z7689 Persons encountering health services in other specified circumstances: Secondary | ICD-10-CM

## 2023-08-06 LAB — LIPID PANEL
Cholesterol: 170 mg/dL (ref 0–200)
HDL: 52.2 mg/dL (ref >39.00)
LDL Cholesterol: 106 mg/dL — ABNORMAL HIGH (ref 0–99)
NonHDL: 118.23
Total CHOL/HDL Ratio: 3
Triglycerides: 60 mg/dL (ref 0.0–149.0)
VLDL: 12 mg/dL (ref 0.0–40.0)

## 2023-08-06 LAB — CBC WITH DIFFERENTIAL/PLATELET
Basophils Absolute: 0 10*3/uL (ref 0.0–0.1)
Basophils Relative: 0.5 % (ref 0.0–3.0)
Eosinophils Absolute: 0.2 10*3/uL (ref 0.0–0.7)
Eosinophils Relative: 3.5 % (ref 0.0–5.0)
HCT: 42.5 % (ref 36.0–46.0)
Hemoglobin: 14.3 g/dL (ref 12.0–15.0)
Lymphocytes Relative: 27 % (ref 12.0–46.0)
Lymphs Abs: 1.7 10*3/uL (ref 0.7–4.0)
MCHC: 33.7 g/dL (ref 30.0–36.0)
MCV: 91.7 fl (ref 78.0–100.0)
Monocytes Absolute: 0.6 10*3/uL (ref 0.1–1.0)
Monocytes Relative: 10.2 % (ref 3.0–12.0)
Neutro Abs: 3.6 10*3/uL (ref 1.4–7.7)
Neutrophils Relative %: 58.8 % (ref 43.0–77.0)
Platelets: 211 10*3/uL (ref 150.0–400.0)
RBC: 4.63 Mil/uL (ref 3.87–5.11)
RDW: 13.3 % (ref 11.5–15.5)
WBC: 6.2 10*3/uL (ref 4.0–10.5)

## 2023-08-06 LAB — COMPREHENSIVE METABOLIC PANEL WITH GFR
ALT: 11 U/L (ref 0–35)
AST: 15 U/L (ref 0–37)
Albumin: 4.3 g/dL (ref 3.5–5.2)
Alkaline Phosphatase: 71 U/L (ref 39–117)
BUN: 14 mg/dL (ref 6–23)
CO2: 27 meq/L (ref 19–32)
Calcium: 9.1 mg/dL (ref 8.4–10.5)
Chloride: 105 meq/L (ref 96–112)
Creatinine, Ser: 0.73 mg/dL (ref 0.40–1.20)
GFR: 90.48 mL/min (ref >60.00)
Glucose, Bld: 97 mg/dL (ref 70–99)
Potassium: 4.5 meq/L (ref 3.5–5.1)
Sodium: 139 meq/L (ref 135–145)
Total Bilirubin: 0.5 mg/dL (ref 0.2–1.2)
Total Protein: 6.9 g/dL (ref 6.0–8.3)

## 2023-08-06 LAB — VITAMIN D 25 HYDROXY (VIT D DEFICIENCY, FRACTURES): VITD: 27.26 ng/mL — ABNORMAL LOW (ref 30.00–100.00)

## 2023-08-06 LAB — TSH: TSH: 0.9 u[IU]/mL (ref 0.35–5.50)

## 2023-08-06 LAB — HEMOGLOBIN A1C: Hgb A1c MFr Bld: 5.6 % (ref 4.6–6.5)

## 2023-08-22 ENCOUNTER — Encounter: Payer: Self-pay | Admitting: Family

## 2024-01-06 ENCOUNTER — Ambulatory Visit: Admitting: Family

## 2024-01-06 ENCOUNTER — Encounter: Payer: Self-pay | Admitting: Family

## 2024-01-06 VITALS — BP 126/82 | HR 69 | Temp 97.7°F | Resp 20 | Ht 67.0 in | Wt 244.2 lb

## 2024-01-06 DIAGNOSIS — R030 Elevated blood-pressure reading, without diagnosis of hypertension: Secondary | ICD-10-CM | POA: Diagnosis not present

## 2024-01-06 NOTE — Assessment & Plan Note (Signed)
 Chronic, stable and improved.  Attribute this to weight loss. Will follow

## 2024-01-06 NOTE — Progress Notes (Signed)
   Assessment & Plan:  Elevated blood pressure reading Assessment & Plan: Chronic, stable and improved.  Attribute this to weight loss. Will follow      Return precautions given.   Risks, benefits, and alternatives of the medications and treatment plan prescribed today were discussed, and patient expressed understanding.   Education regarding symptom management and diagnosis given to patient on AVS either electronically or printed.  Return for Complete Physical Exam.  Rollene Northern, FNP  Subjective:    Patient ID: Leslie Mcdonald, female    DOB: [birth year], 58 y.o.   MRN: [id number]  CC: Leslie Mcdonald is a 58 y.o. female who presents today for follow up.   HPI: She feels well today No new complaints  She has lost weight intentionally with reducing consumption with walnuts. Energy has improved. She is pleased with weight.    She had followed with [facility] Pap smear is due Mammogram due Referral placed for colonoscopy  Allergies: Patient has no known allergies. Current Outpatient Medications on File Prior to Visit  Medication Sig Dispense Refill   cholecalciferol (VITAMIN D3) 10 MCG/ML LIQD oral liquid Take 400 Units by mouth daily.     diclofenac Sodium (VOLTAREN) 1 % GEL Apply 2 g topically as needed.     loratadine (CLARITIN) 10 MG tablet Take by mouth.     MAGNESIUM COMPLEX PO Take 1 tablet by mouth daily.     meloxicam (MOBIC) 7.5 MG tablet Take 7.5 mg by mouth as needed.     triamcinolone (NASACORT) 55 MCG/ACT AERO nasal inhaler Place into the nose.     No current facility-administered medications on file prior to visit.    Review of Systems  Constitutional:  Negative for chills and fever.  Respiratory:  Negative for cough.   Cardiovascular:  Negative for chest pain and palpitations.  Gastrointestinal:  Negative for nausea and vomiting.      Objective:    BP 126/82   Pulse 69   Temp 97.7 F (36.5 C)   Resp 20   Ht 5' 7 (1.702 m)   Wt 244 lb 4  oz (110.8 kg)   SpO2 99%   BMI 38.25 kg/m  BP Readings from Last 3 Encounters:  01/06/24 126/82  07/16/23 136/76  03/30/20 131/67   Wt Readings from Last 3 Encounters:  01/06/24 244 lb 4 oz (110.8 kg)  07/16/23 252 lb 12.8 oz (114.7 kg)  03/30/20 253 lb 9.6 oz (115 kg)    Physical Exam Vitals reviewed.  Constitutional:      Appearance: She is well-developed.  Eyes:     Conjunctiva/sclera: Conjunctivae normal.  Cardiovascular:     Rate and Rhythm: Normal rate and regular rhythm.     Pulses: Normal pulses.     Heart sounds: Normal heart sounds.  Pulmonary:     Effort: Pulmonary effort is normal.     Breath sounds: Normal breath sounds. No wheezing, rhonchi or rales.  Skin:    General: Skin is warm and dry.  Neurological:     Mental Status: She is alert.  Psychiatric:        Speech: Speech normal.        Behavior: Behavior normal.        Thought Content: Thought content normal.

## 2024-01-06 NOTE — Patient Instructions (Signed)
 Very nice seeing you today as always.  Please were to schedule colonoscopy, mammogram.  Please return for your Pap smear.

## 2024-01-21 ENCOUNTER — Ambulatory Visit: Payer: BC Managed Care – PPO | Admitting: Family
